# Patient Record
Sex: Male | Born: 1995 | Race: White | Hispanic: No | Marital: Single | Smoking: Never smoker
Health system: Southern US, Community
[De-identification: ages and names within clinical notes are randomized; demographics above are authoritative.]

## PROBLEM LIST (undated history)

## (undated) DIAGNOSIS — F32A Depression, unspecified: Secondary | ICD-10-CM

## (undated) DIAGNOSIS — IMO0002 Reserved for concepts with insufficient information to code with codable children: Secondary | ICD-10-CM

## (undated) DIAGNOSIS — K219 Gastro-esophageal reflux disease without esophagitis: Secondary | ICD-10-CM

## (undated) DIAGNOSIS — F419 Anxiety disorder, unspecified: Secondary | ICD-10-CM

## (undated) DIAGNOSIS — R079 Chest pain, unspecified: Secondary | ICD-10-CM

## (undated) DIAGNOSIS — T7840XA Allergy, unspecified, initial encounter: Secondary | ICD-10-CM

## (undated) DIAGNOSIS — F329 Major depressive disorder, single episode, unspecified: Secondary | ICD-10-CM

## (undated) DIAGNOSIS — M419 Scoliosis, unspecified: Secondary | ICD-10-CM

## (undated) DIAGNOSIS — F845 Asperger's syndrome: Secondary | ICD-10-CM

## (undated) HISTORY — DX: Gastro-esophageal reflux disease without esophagitis: K21.9

## (undated) HISTORY — PX: COLONOSCOPY: SHX174

## (undated) HISTORY — DX: Reserved for concepts with insufficient information to code with codable children: IMO0002

## (undated) HISTORY — DX: Allergy, unspecified, initial encounter: T78.40XA

## (undated) HISTORY — PX: UPPER GI ENDOSCOPY: SHX6162

## (undated) HISTORY — DX: Depression, unspecified: F32.A

## (undated) HISTORY — DX: Major depressive disorder, single episode, unspecified: F32.9

## (undated) HISTORY — DX: Chest pain, unspecified: R07.9

## (undated) HISTORY — DX: Asperger's syndrome: F84.5

## (undated) HISTORY — DX: Anxiety disorder, unspecified: F41.9

---
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
## 2014-07-13 ENCOUNTER — Ambulatory Visit (INDEPENDENT_AMBULATORY_CARE_PROVIDER_SITE_OTHER): Payer: Federal, State, Local not specified - PPO | Admitting: Emergency Medicine

## 2014-07-13 ENCOUNTER — Ambulatory Visit (INDEPENDENT_AMBULATORY_CARE_PROVIDER_SITE_OTHER): Payer: Federal, State, Local not specified - PPO

## 2014-07-13 VITALS — BP 124/72 | HR 118 | Temp 98.4°F | Resp 18 | Ht 69.0 in | Wt 174.0 lb

## 2014-07-13 DIAGNOSIS — F411 Generalized anxiety disorder: Secondary | ICD-10-CM | POA: Diagnosis not present

## 2014-07-13 DIAGNOSIS — F845 Asperger's syndrome: Secondary | ICD-10-CM

## 2014-07-13 DIAGNOSIS — R079 Chest pain, unspecified: Secondary | ICD-10-CM

## 2014-07-13 LAB — POCT CBC
GRANULOCYTE PERCENT: 59.8 % (ref 37–80)
HCT, POC: 48.6 % (ref 43.5–53.7)
Hemoglobin: 15.8 g/dL (ref 14.1–18.1)
LYMPH, POC: 1.7 (ref 0.6–3.4)
MCH, POC: 26.5 pg — AB (ref 27–31.2)
MCHC: 32.5 g/dL (ref 31.8–35.4)
MCV: 81.6 fL (ref 80–97)
MID (cbc): 0.4 (ref 0–0.9)
MPV: 7.6 fL (ref 0–99.8)
PLATELET COUNT, POC: 216 10*3/uL (ref 142–424)
POC GRANULOCYTE: 3.2 (ref 2–6.9)
POC LYMPH %: 32.7 % (ref 10–50)
POC MID %: 7.5 %M (ref 0–12)
RBC: 5.95 M/uL (ref 4.69–6.13)
RDW, POC: 12.9 %
WBC: 5.3 10*3/uL (ref 4.6–10.2)

## 2014-07-13 LAB — COMPLETE METABOLIC PANEL WITH GFR
ALBUMIN: 4.7 g/dL (ref 3.5–5.2)
ALK PHOS: 69 U/L (ref 39–117)
ALT: 18 U/L (ref 0–53)
AST: 17 U/L (ref 0–37)
BILIRUBIN TOTAL: 0.9 mg/dL (ref 0.2–1.2)
BUN: 10 mg/dL (ref 6–23)
CO2: 27 mEq/L (ref 19–32)
Calcium: 9.7 mg/dL (ref 8.4–10.5)
Chloride: 101 mEq/L (ref 96–112)
Creat: 1.16 mg/dL (ref 0.50–1.35)
GFR, Est African American: >89 mL/min
GFR, Est Non African American: >89 mL/min
Glucose, Bld: 91 mg/dL (ref 70–99)
POTASSIUM: 4 meq/L (ref 3.5–5.3)
SODIUM: 140 meq/L (ref 135–145)
TOTAL PROTEIN: 7.1 g/dL (ref 6.0–8.3)

## 2014-07-13 LAB — CK: Total CK: 119 U/L (ref 7–232)

## 2014-07-13 LAB — TSH: TSH: 2.174 u[IU]/mL (ref 0.350–4.500)

## 2014-07-13 LAB — T4, FREE: Free T4: 1.48 ng/dL (ref 0.80–1.80)

## 2014-07-13 MED ORDER — LORAZEPAM 1 MG PO TABS: ORAL_TABLET | ORAL | Status: DC

## 2014-07-13 NOTE — Patient Instructions (Signed)
Generalized Anxiety Disorder Generalized anxiety disorder (GAD) is a mental disorder. It interferes with life functions, including relationships, work, and school. GAD is different from normal anxiety, which everyone experiences at some point in their lives in response to specific life events and activities. Normal anxiety actually helps us prepare for and get through these life events and activities. Normal anxiety goes away after the event or activity is over.  GAD causes anxiety that is not necessarily related to specific events or activities. It also causes excess anxiety in proportion to specific events or activities. The anxiety associated with GAD is also difficult to control. GAD can vary from mild to severe. People with severe GAD can have intense waves of anxiety with physical symptoms (panic attacks).  SYMPTOMS The anxiety and worry associated with GAD are difficult to control. This anxiety and worry are related to many life events and activities and also occur more days than not for 6 months or longer. People with GAD also have three or more of the following symptoms (one or more in children):  Restlessness.   Fatigue.  Difficulty concentrating.   Irritability.  Muscle tension.  Difficulty sleeping or unsatisfying sleep. DIAGNOSIS GAD is diagnosed through an assessment by your health care provider. Your health care provider will ask you questions aboutyour mood,physical symptoms, and events in your life. Your health care provider may ask you about your medical history and use of alcohol or drugs, including prescription medicines. Your health care provider may also do a physical exam and blood tests. Certain medical conditions and the use of certain substances can cause symptoms similar to those associated with GAD. Your health care provider may refer you to a mental health specialist for further evaluation. TREATMENT The following therapies are usually used to treat GAD:    Medication. Antidepressant medication usually is prescribed for long-term daily control. Antianxiety medicines may be added in severe cases, especially when panic attacks occur.   Talk therapy (psychotherapy). Certain types of talk therapy can be helpful in treating GAD by providing support, education, and guidance. A form of talk therapy called cognitive behavioral therapy can teach you healthy ways to think about and react to daily life events and activities.  Stress managementtechniques. These include yoga, meditation, and exercise and can be very helpful when they are practiced regularly. A mental health specialist can help determine which treatment is best for you. Some people see improvement with one therapy. However, other people require a combination of therapies. Document Released: 05/04/2012 Document Revised: 05/24/2013 Document Reviewed: 05/04/2012 Exit[employer] Patient Information 2015 [employer]. This information is not intended to replace advice given to you by your health care provider. Make sure you discuss any questions you have with your health care provider.  

## 2014-07-13 NOTE — Progress Notes (Addendum)
Subjective:   This chart was scribed for Nena Jordan, MD by Thea Alken, ED Scribe. This patient was seen in room 9 and the patient's care was started at 11:27 AM.   Patient ID: Juan Weber, male    DOB: [birth year], 19 y.o.   MRN: [id number]  HPI   Chief Complaint  Patient presents with  . Chest Pain    x3 weeks   . Dizziness    yesterday at work   . Depression    see screen    HPI Comments: Juan Weber is a 19 y.o. Male with hx of Asperger's who presents to the [facility] with multiple complaints. He reports foggy vision with a white tint when looking in bright in dim areas, intermittent CP that began a few weeks ago, muscle cramps, HA, shaky, cold and sometimes numb hands that began yesterday, face numbness and dizziness. Pt has been treated for stress and anxiety in the past and takes Wellbutrin, Vyvanse, Risperdal and Claritin, prescribed by his PCP who he hasn't seen in 2 years. He denies feeling depressed or stressed and states the only stressor at this time is his long distance relationship with his male partner who lives in [place]. Pt states he met his partner online and was last able to travel and spend time with her 2 months ago. 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 Pt lives at home with family. Pt denies smoking, drinking, and illicit drugs.  Past Medical History  Diagnosis Date  . Asperger's disorder   . Anxiety   . Depression   . Allergy   . Ulcer   . GERD (gastroesophageal reflux disease)    History reviewed. No pertinent past surgical history. Prior to Admission medications   Medication Sig Start Date End Date Taking? Authorizing Provider  buPROPion (WELLBUTRIN XL) 300 MG 24 hr tablet Take 300 mg by mouth daily.   Yes Historical Provider, MD  lisdexamfetamine (VYVANSE) 60 MG capsule Take  60 mg by mouth once.   Yes Historical Provider, MD  loratadine (CLARITIN) 10 MG tablet Take 10 mg by mouth daily.   Yes Historical Provider, MD  Probiotic Product ([place]) Take 1 tablet by mouth once.   Yes Historical Provider, MD  risperiDONE (RISPERDAL) 0.5 MG tablet Take 0.5 mg by mouth once.   Yes Historical Provider, MD  azithromycin (ZITHROMAX) 250 MG tablet Take all 4 tablets PO x 1 dose Patient not taking: Reported on 07/13/2014 10/14/12   Thao P Le, DO  sucralfate (CARAFATE) 1 GM/10ML suspension Take 10 mLs (1 g total) by mouth 4 (four) times daily. Patient not taking: Reported on 07/13/2014 11/06/11   Robyn Haber, MD   Depression screen [facility] 2/9 07/13/2014  Decreased Interest 2  Down, Depressed, Hopeless 2  PHQ - 2 Score 4  Altered sleeping 2  Tired, decreased energy 3  Change in appetite 2  Feeling bad or failure about yourself  3  Trouble concentrating 1  Moving slowly or fidgety/restless 1  Suicidal thoughts 0  PHQ-9 Score 16  Difficult doing work/chores Not difficult at all   Review of Systems  Eyes: Positive for visual disturbance.  Cardiovascular: Positive for chest pain.  Musculoskeletal: Positive for myalgias.  Neurological: Positive for dizziness and headaches.  Psychiatric/Behavioral: Negative for dysphoric mood. The patient is nervous/anxious.  Objective:   Physical Exam CONSTITUTIONAL: Well developed/well nourished HEAD: Normocephalic/atraumatic EYES: EOMI/PERRL ENMT: Mucous membranes moist NECK: supple no meningeal signs SPINE/BACK:entire spine nontender CV: S1/S2 noted, no murmurs/rubs/gallops noted LUNGS: Lungs are clear to auscultation bilaterally, no apparent distress ABDOMEN: soft, nontender, no rebound or guarding, bowel sounds noted throughout abdomen GU:no cva tenderness NEURO: Pt is awake/alert/appropriate, moves all extremitiesx4.  No facial droop.   EXTREMITIES: pulses normal/equal, full ROM SKIN: warm, color  normal PSYCH: no abnormalities of mood noted, alert and oriented to situation  Filed Vitals:   07/13/14 1017  BP: 124/72  Pulse: 118  Temp: 98.4 F (36.9 C)  TempSrc: Oral  Resp: 18  Height: _0  (1.753 m)  Weight: 174 lb (78.926 kg)  SpO2: 98%   Results for orders placed or performed in visit on 07/13/14  POCT CBC  Result Value Ref Range   WBC 5.3 4.6 - 10.2 K/uL   Lymph, poc 1.7 0.6 - 3.4   POC LYMPH PERCENT 32.7 10 - 50 %L   MID (cbc) 0.4 0 - 0.9   POC MID % 7.5 0 - 12 %M   POC Granulocyte 3.2 2 - 6.9   Granulocyte percent 59.8 37 - 80 %G   RBC 5.95 4.69 - 6.13 M/uL   Hemoglobin 15.8 14.1 - 18.1 g/dL   HCT, POC 48.6 43.5 - 53.7 %   MCV 81.6 80 - 97 fL   MCH, POC 26.5 (A) 27 - 31.2 pg   MCHC 32.5 31.8 - 35.4 g/dL   RDW, POC 12.9 %   Platelet Count, POC 216 142 - 424 K/uL   MPV 7.6 0 - 99.8 fL  PDQ testing was moderate to severe. [facility] reading (PRIMARY) by  Dr.Daub no acute disease  EKG normal sinus rhythm Assessment & Plan:  I advised patient to call Dr. Corky Sing office to get an appointment. I think he would do well to come off of the Vyvanse. I gave him a prescription for #10 1 mg Ativan to take if he felt a panic attack coming on.I personally performed the services described in this documentation, which was scribed in my presence. The recorded information has been reviewed and is accurate. Patient is trying to get his girlfriend moved down here from [place]. He is in a sexual relationship with her. Since he is not able to see her he masturbates at least twice a day. This gives him some sexual relief..  I personally performed the services described in this documentation, which was scribed in my presence. The recorded information has been reviewed and is accurate.  Nena Jordan, MD Nena Jordan, MD

## 2014-07-18 ENCOUNTER — Encounter: Payer: Self-pay | Admitting: Emergency Medicine

## 2014-09-14 ENCOUNTER — Ambulatory Visit (INDEPENDENT_AMBULATORY_CARE_PROVIDER_SITE_OTHER): Payer: Federal, State, Local not specified - PPO | Admitting: Family Medicine

## 2014-09-14 VITALS — BP 130/90 | HR 99 | Temp 98.7°F | Resp 16 | Ht 68.25 in | Wt 177.0 lb

## 2014-09-14 DIAGNOSIS — F32A Depression, unspecified: Secondary | ICD-10-CM

## 2014-09-14 DIAGNOSIS — F329 Major depressive disorder, single episode, unspecified: Secondary | ICD-10-CM

## 2014-09-14 DIAGNOSIS — F411 Generalized anxiety disorder: Secondary | ICD-10-CM | POA: Diagnosis not present

## 2014-09-14 DIAGNOSIS — R0789 Other chest pain: Secondary | ICD-10-CM | POA: Diagnosis not present

## 2014-09-14 NOTE — Progress Notes (Signed)
Chief Complaint:  Chief Complaint  Patient presents with  . Pain    All over body    HPI: Juan Weber is a 19 y.o. male who reports to [facility] today complaining of:  Here for recheck of his prior complaints of chronic left cehst wall pain, he has had this since he was younger and now has progressively worsen . He has nonspecific upper left chest wall pain, starts around his left collarbone or 2 inches above his heart when hs is stressed, exerting himself,  Or can be at any time. He states is can feel like a pinch or very sharp. He has some nausea and also dizzy spells periodically associated with this , also  loss of vision where everything is blurry, it can get to the point where is he seeing spots. IT can last anywhere from 30 min to 3 hours, it does radiate all over to the rest of his body. The sxs stop spotnaneously or when he does deep breathing. 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   On his last visit he was given ativan, he took 1-2 pills and di not like how it made him feel. He went back to his psychiatrist who prescribes his vyvanse, risperdal and also wellbutrin which he has been on for many years to help contrl is anxiety/depression and outburst. He  States Dr Dalene Seltzer dnot cahnge his meds. Labs were done on the last visit and were all normal. 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 This person was out of work for 1 week and he was sxs free but he ha shad more sxs when this person ha been back.   The patient doe snot see a therapist, last time he saw one he felt it did not help him.    Prior OV from Dr Perfecto Kingdom visit in 06/2014 [name] is a 19 y.o. Male with hx of Asperger's who presents to the [facility] with multiple complaints. He  reports foggy vision with a white tint when looking in bright in dim areas, intermittent CP that began a few weeks ago, muscle cramps, HA, shaky, cold and sometimes numb hands that began yesterday, face numbness and dizziness. Pt has been treated for stress and anxiety in the past and takes Wellbutrin, Vyvanse, Risperdal and Claritin, prescribed by his PCP who he hasn't seen in 2 years. He denies feeling depressed or stressed and states the only stressor at this time is his long distance relationship with his male partner who lives in [place]. Pt states he met his partner online and was last able to travel and spend time with her 2 months ago. 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 Pt lives at home with family. Pt denies smoking, drinking, and illicit drugs.   Past Medical History  Diagnosis Date  . Asperger's disorder   . Anxiety   . Depression   . Allergy   . Ulcer   . GERD (gastroesophageal reflux disease)    History reviewed. No pertinent past surgical history. Social History   Social  History  . Marital Status: Single    Spouse Name: N/A  . Number of Children: N/A  . Years of Education: N/A   Social History Main Topics  . Smoking status: Never Smoker   . Smokeless tobacco: None  . Alcohol Use: No  . Drug Use: No  . Sexual Activity: Not Currently   Other Topics Concern  . None   Social History Narrative   Family History  Problem Relation Age of Onset  . Adopted: Yes   No Known Allergies Prior to Admission medications   Medication Sig Start Date End Date Taking? Authorizing Provider  buPROPion (WELLBUTRIN XL) 300 MG 24 hr tablet Take 300 mg by mouth daily.   Yes Historical Provider, MD  lisdexamfetamine (VYVANSE) 60 MG capsule Take 60 mg by mouth once.   Yes Historical Provider, MD  loratadine (CLARITIN) 10 MG tablet  Take 10 mg by mouth daily.   Yes Historical Provider, MD  LORazepam (ATIVAN) 1 MG tablet Take 1 tablet at onset of any anxiety attack. 07/13/14  Yes Darlyne Russian, MD  Probiotic Product ([place]) Take 1 tablet by mouth once.   Yes Historical Provider, MD  risperiDONE (RISPERDAL) 0.5 MG tablet Take 0.5 mg by mouth once.   Yes Historical Provider, MD  sucralfate (CARAFATE) 1 GM/10ML suspension Take 10 mLs (1 g total) by mouth 4 (four) times daily. 11/06/11  Yes Robyn Haber, MD  azithromycin (ZITHROMAX) 250 MG tablet Take all 4 tablets PO x 1 dose Patient not taking: Reported on 07/13/2014 10/14/12   Zoha Spranger P Deshanna Kama, DO     ROS: The patient denies fevers, chills, night sweats, unintentional weight loss, palpitations, wheezing, dyspnea on exertion, nausea, vomiting, abdominal pain, dysuria, hematuria, melena, numbness, weakness, or tingling.   All other systems have been reviewed and were otherwise negative with the exception of those mentioned in the HPI and as above.    PHYSICAL EXAM: Filed Vitals:   09/14/14 1930  BP: 130/90  Pulse: 99  Temp: 98.7 F (37.1 C)  Resp: 16   Body mass index is 26.7 kg/(m^2).   General: Alert, no acute distress HEENT:  Normocephalic, atraumatic, oropharynx patent. EOMI, PERRLA Cardiovascular:  Regular rate and rhythm, no rubs murmurs or gallops.  No Carotid bruits, radial pulse intact. No pedal edema.  Respiratory: Clear to auscultation bilaterally.  No wheezes, rales, or rhonchi.  No cyanosis, no use of accessory musculature He has some chest wall tenderness but nothing substantial Abdominal: No organomegaly, abdomen is soft and non-tender, positive bowel sounds. No masses. Skin: No rashes. Neurologic: Facial musculature symmetric. Psychiatric: Patient acts appropriately throughout our interaction.  Lymphatic: No cervical or submandibular lymphadenopathy Musculoskeletal: Gait intact. No edema, tenderness + gynecomastia   LABS: Results  for orders placed or performed in visit on 07/13/14  TSH  Result Value Ref Range   TSH 2.174 0.350 - 4.500 uIU/mL  T4, free  Result Value Ref Range   Free T4 1.48 0.80 - 1.80 ng/dL  COMPLETE METABOLIC PANEL WITH GFR  Result Value Ref Range   Sodium 140 135 - 145 mEq/L   Potassium 4.0 3.5 - 5.3 mEq/L   Chloride 101 96 - 112 mEq/L   CO2 27 19 - 32 mEq/L   Glucose, Bld 91 70 - 99 mg/dL   BUN 10 6 - 23 mg/dL   Creat 1.16 0.50 - 1.35 mg/dL   Total Bilirubin 0.9 0.2 - 1.2 mg/dL   Alkaline Phosphatase 69 39 - 117  U/L   AST 17 0 - 37 U/L   ALT 18 0 - 53 U/L   Total Protein 7.1 6.0 - 8.3 g/dL   Albumin 4.7 3.5 - 5.2 g/dL   Calcium 9.7 8.4 - 10.5 mg/dL   GFR, Est African American >89 mL/min   GFR, Est Non African American >89 mL/min  CK  Result Value Ref Range   Total CK 119 7 - 232 U/L  POCT CBC  Result Value Ref Range   WBC 5.3 4.6 - 10.2 K/uL   Lymph, poc 1.7 0.6 - 3.4   POC LYMPH PERCENT 32.7 10 - 50 %L   MID (cbc) 0.4 0 - 0.9   POC MID % 7.5 0 - 12 %M   POC Granulocyte 3.2 2 - 6.9   Granulocyte percent 59.8 37 - 80 %G   RBC 5.95 4.69 - 6.13 M/uL   Hemoglobin 15.8 14.1 - 18.1 g/dL   HCT, POC 48.6 43.5 - 53.7 %   MCV 81.6 80 - 97 fL   MCH, POC 26.5 (A) 27 - 31.2 pg   MCHC 32.5 31.8 - 35.4 g/dL   RDW, POC 12.9 %   Platelet Count, POC 216 142 - 424 K/uL   MPV 7.6 0 - 99.8 fL     EKG/XRAY:   Primary read interpreted by [facility] Marin Comment at [facility].   ASSESSMENT/PLAN: Encounter Diagnoses  Name Primary?  . Atypical chest pain Yes  . Anxiety state   . Depression    Reda is a pleasant 19 y/o male with a PMH of anxiety, depresssion and Aspergers who presents with acute on chronic left sided CP which is worse with stress but at time also exertion and other factors. I do not suspect this is cardiac related but he i son a lot of medicines that can affect the heart and he is worried and I have referred him to cardiology to rule that out for piece of mind. He does have GERD but this does  not seem GERD related.  I have advised him to return to Dr Corky Sing office, his psychiatrist to see if there needs to be any change sor adjustments to his meds.  Fu prn   Gross sideeffects, risk and benefits, and alternatives of medications d/w patient. Patient is aware that all medications have potential sideeffects and we are unable to predict every sideeffect or drug-drug interaction that may occur.  Dabid Godown DO  09/15/2014 6:58 AM

## 2014-11-17 ENCOUNTER — Encounter: Payer: Self-pay | Admitting: Family Medicine

## 2015-06-20 ENCOUNTER — Ambulatory Visit (INDEPENDENT_AMBULATORY_CARE_PROVIDER_SITE_OTHER): Payer: Federal, State, Local not specified - PPO

## 2015-06-20 ENCOUNTER — Ambulatory Visit (INDEPENDENT_AMBULATORY_CARE_PROVIDER_SITE_OTHER): Payer: Federal, State, Local not specified - PPO | Admitting: Physician Assistant

## 2015-06-20 VITALS — BP 123/88 | HR 102 | Temp 97.7°F | Resp 16 | Ht 68.5 in | Wt 193.0 lb

## 2015-06-20 DIAGNOSIS — K219 Gastro-esophageal reflux disease without esophagitis: Secondary | ICD-10-CM

## 2015-06-20 DIAGNOSIS — R319 Hematuria, unspecified: Secondary | ICD-10-CM | POA: Diagnosis not present

## 2015-06-20 DIAGNOSIS — R350 Frequency of micturition: Secondary | ICD-10-CM | POA: Diagnosis not present

## 2015-06-20 DIAGNOSIS — Z139 Encounter for screening, unspecified: Secondary | ICD-10-CM

## 2015-06-20 LAB — CBC
HEMATOCRIT: 46.7 % (ref 38.5–50.0)
Hemoglobin: 15.8 g/dL (ref 13.2–17.1)
MCH: 28.1 pg (ref 27.0–33.0)
MCHC: 33.8 g/dL (ref 32.0–36.0)
MCV: 82.9 fL (ref 80.0–100.0)
MPV: 10.9 fL (ref 7.5–12.5)
PLATELETS: 217 10*3/uL (ref 140–400)
RBC: 5.63 MIL/uL (ref 4.20–5.80)
RDW: 14 % (ref 11.0–15.0)
WBC: 5.6 10*3/uL (ref 3.8–10.8)

## 2015-06-20 LAB — COMPLETE METABOLIC PANEL WITH GFR
ALT: 23 U/L (ref 9–46)
AST: 17 U/L (ref 10–40)
Albumin: 4.6 g/dL (ref 3.6–5.1)
Alkaline Phosphatase: 73 U/L (ref 40–115)
BILIRUBIN TOTAL: 0.7 mg/dL (ref 0.2–1.2)
BUN: 11 mg/dL (ref 7–25)
CHLORIDE: 101 mmol/L (ref 98–110)
CO2: 24 mmol/L (ref 20–31)
Calcium: 9.9 mg/dL (ref 8.6–10.3)
Creat: 1.04 mg/dL (ref 0.60–1.35)
Glucose, Bld: 91 mg/dL (ref 65–99)
Potassium: 4.3 mmol/L (ref 3.5–5.3)
Sodium: 138 mmol/L (ref 135–146)
Total Protein: 7.3 g/dL (ref 6.1–8.1)

## 2015-06-20 LAB — POCT URINALYSIS DIP (MANUAL ENTRY)
BILIRUBIN UA: NEGATIVE
Bilirubin, UA: NEGATIVE
Glucose, UA: NEGATIVE
Leukocytes, UA: NEGATIVE
Nitrite, UA: NEGATIVE
PROTEIN UA: NEGATIVE
Spec Grav, UA: 1.02
Urobilinogen, UA: 0.2
pH, UA: 7

## 2015-06-20 LAB — POC MICROSCOPIC URINALYSIS (UMFC)

## 2015-06-20 LAB — POCT GLYCOSYLATED HEMOGLOBIN (HGB A1C): HEMOGLOBIN A1C: 5.5

## 2015-06-20 LAB — HIV ANTIBODY (ROUTINE TESTING W REFLEX): HIV: NONREACTIVE

## 2015-06-20 MED ORDER — OMEPRAZOLE 20 MG PO CPDR: 20.0000 mg | DELAYED_RELEASE_CAPSULE | Freq: Every day | ORAL | Status: DC

## 2015-06-20 MED ORDER — OMEPRAZOLE 10 MG PO CPDR: 10.0000 mg | DELAYED_RELEASE_CAPSULE | Freq: Every day | ORAL | Status: DC

## 2015-06-20 NOTE — Progress Notes (Signed)
Did you give him a strainer in case he does pass a stone?

## 2015-06-20 NOTE — Progress Notes (Signed)
06/20/2015 1:09 PM   DOB: Jun 20, 1995 / MRN: [id number]  SUBJECTIVE:  Juan Weber is a 20 y.o. male presenting for bladder discomfort and GERD.    Reports the bladder discomfort started 3 days ago.  Describes the pain ans dull, transient, and sporadic.  He denies pain at this time.  Associates mild dysuria and urinary frequency.  Denies history of stones.  No new partners.  Complains of daily acid reflux.  Has been suffering "for years" and cant remember exactly when it started  He is getting worse and associates certain spicy and rich foods.  Does not drink coffee.  Does not elevate the head of the bed.  Takes "acid reducer" as needed and does not know the name.  Says he gets relief within thirty minutes and relief may last as long as 6 hours.  Denies N/V, dysphagia, odynophagia, weight loss, smoking.   There is no immunization history on file for this patient.   He has No Known Allergies.   He  has a past medical history of Asperger's disorder; Anxiety; Depression; Allergy; Ulcer; GERD (gastroesophageal reflux disease); and Chest pain.    He  reports that he has never smoked. He does not have any smokeless tobacco history on file. He reports that he does not drink alcohol or use illicit drugs. He  reports that he does not currently engage in sexual activity. The patient  has no past surgical history on file.  His family history is not on file. He was adopted.  Review of Systems  Constitutional: Negative for fever.  HENT: Negative for sore throat.   Eyes: Negative for photophobia.  Respiratory: Negative for cough.   Cardiovascular: Negative for chest pain.  Gastrointestinal: Negative for diarrhea, constipation, blood in stool and melena.  Musculoskeletal: Negative for myalgias.  Skin: Negative for rash.  Neurological: Negative for dizziness.    Problem list and medications reviewed and updated by myself where necessary, and exist elsewhere in the encounter.   OBJECTIVE:  BP  123/88 mmHg  Pulse 102  Temp(Src) 97.7 F (36.5 C) (Oral)  Resp 16  Ht 5' 8.5" (1.74 m)  Wt 193 lb (87.544 kg)  BMI 28.92 kg/m2  SpO2 99%  Physical Exam  Constitutional: He is oriented to person, place, and time. He appears well-developed. He does not appear ill.  Eyes: Conjunctivae and EOM are normal. Pupils are equal, round, and reactive to light.  Cardiovascular: Normal rate and regular rhythm.   Pulmonary/Chest: Effort normal and breath sounds normal.  Abdominal: Soft. Bowel sounds are normal. He exhibits no distension and no mass. There is no tenderness. There is no rebound and no guarding.  Musculoskeletal: Normal range of motion.  Neurological: He is alert and oriented to person, place, and time. No cranial nerve deficit. Coordination normal.  Skin: Skin is warm and dry. He is not diaphoretic.  Psychiatric: He has a normal mood and affect.  Nursing note and vitals reviewed.   Results for orders placed or performed in visit on 06/20/15 (from the past 72 hour(s))  POCT Microscopic Urinalysis ([facility])     Status: Abnormal   Collection Time: 06/20/15 11:47 AM  Result Value Ref Range   WBC,UR,HPF,POC None None WBC/hpf   RBC,UR,HPF,POC Too numerous to count  (A) None RBC/hpf   Bacteria Few (A) None, Too numerous to count   Mucus Present (A) Absent   Epithelial Cells, UR Per Microscopy Few (A) None, Too numerous to count cells/hpf  POCT urinalysis dipstick  Status: Abnormal   Collection Time: 06/20/15 11:47 AM  Result Value Ref Range   Color, UA yellow yellow   Clarity, UA clear clear   Glucose, UA negative negative   Bilirubin, UA negative negative   Ketones, POC UA negative negative   Spec Grav, UA 1.020    Blood, UA moderate (A) negative   pH, UA 7.0    Protein Ur, POC negative negative   Urobilinogen, UA 0.2    Nitrite, UA Negative Negative   Leukocytes, UA Negative Negative    Dg Abd 1 View  06/20/2015  CLINICAL DATA:  Dysuria and hematuria. EXAM: ABDOMEN - 1  VIEW COMPARISON:  10/30/2006 FINDINGS: No abnormal stool retention or obstruction. No visible urinary calculus. The questioned right renal calculus on the previous study could be obscured by bowel gas. No concerning intra-abdominal mass effect. Negative visualized skeleton. IMPRESSION: Negative study.  No evidence of urolithiasis. Electronically Signed   By: Marnee Spring M.D.   On: 06/20/2015 12:54    ASSESSMENT AND PLAN  Omaree was seen today for bladder discomfort and gastroesophageal reflux.  Diagnoses and all orders for this visit:  Frequent urination: He has quite a bit of blood in his urine. Will screen for a stone. His symptoms are mild and rads are negative.  Advised we hold on therapy until work up is complete at which time I will call him and see how he is feeling.  He may need a CT if he does not improve on his own and work up is negative.  -     POCT Microscopic Urinalysis ([facility]) -     POCT urinalysis dipstick -     POCT glycosylated hemoglobin (Hb A1C) -     COMPLETE METABOLIC PANEL WITH GFR -     CBC   Gastroesophageal reflux disease, esophagitis presence not specified: Will check for H. Pylori.  Will start omeprazole 20 mg daily qam 30 minutes before breakfast either way.  -     H. pylori breath test  Hematuria -     Urine culture -     GC/Chlamydia Probe Amp -     DG Abd 1 View; Future  Screening -     HIV antibody    The patient was advised to call or return to clinic if he does not see an improvement in symptoms or to seek the care of the closest emergency department if he worsens with the above plan.   Deliah Boston, MHS, PA-C [facility] 06/20/2015 1:09 PM

## 2015-06-20 NOTE — Patient Instructions (Signed)
     IF you received an x-ray today, you will receive an invoice from [facility]. Please contact [facility] at [phone] with questions or concerns regarding your invoice.   IF you received labwork today, you will receive an invoice from [facility]. Please contact [facility] at [phone] with questions or concerns regarding your invoice.   Our billing staff will not be able to assist you with questions regarding bills from these companies.  You will be contacted with the lab results as soon as they are available. The fastest way to get your results is to activate your My Chart account. Instructions are located on the last page of this paperwork. If you have not heard from us regarding the results in 2 weeks, please contact this office.      

## 2015-06-21 ENCOUNTER — Other Ambulatory Visit: Payer: Self-pay | Admitting: Physician Assistant

## 2015-06-21 DIAGNOSIS — A048 Other specified bacterial intestinal infections: Secondary | ICD-10-CM

## 2015-06-21 LAB — GC/CHLAMYDIA PROBE AMP
CT PROBE, AMP APTIMA: NOT DETECTED
GC Probe RNA: NOT DETECTED

## 2015-06-21 LAB — URINE CULTURE

## 2015-06-21 LAB — H. PYLORI BREATH TEST: H. pylori Breath Test: DETECTED — AB

## 2015-06-21 MED ORDER — AMOXICILL-CLARITHRO-LANSOPRAZ PO MISC: Freq: Two times a day (BID) | ORAL | Status: DC

## 2015-06-21 NOTE — Progress Notes (Signed)
H. Pylori positive.  Will start therapy.  Deliah BostonMichael Jerrell Mangel, MS, PA-C 8:48 PM, 06/21/2015

## 2015-06-22 NOTE — Progress Notes (Signed)
Pt advised.

## 2015-06-24 ENCOUNTER — Emergency Department (HOSPITAL_COMMUNITY): Payer: Federal, State, Local not specified - PPO

## 2015-06-24 ENCOUNTER — Emergency Department (HOSPITAL_COMMUNITY)
Admission: EM | Admit: 2015-06-24 | Discharge: 2015-06-24 | Disposition: A | Discharge status: Home / Self Care | Payer: Federal, State, Local not specified - PPO | Attending: Emergency Medicine | Admitting: Emergency Medicine

## 2015-06-24 ENCOUNTER — Encounter (HOSPITAL_COMMUNITY): Payer: Self-pay | Admitting: Emergency Medicine

## 2015-06-24 DIAGNOSIS — N200 Calculus of kidney: Secondary | ICD-10-CM | POA: Insufficient documentation

## 2015-06-24 DIAGNOSIS — F329 Major depressive disorder, single episode, unspecified: Secondary | ICD-10-CM | POA: Diagnosis not present

## 2015-06-24 DIAGNOSIS — Z79899 Other long term (current) drug therapy: Secondary | ICD-10-CM | POA: Diagnosis not present

## 2015-06-24 DIAGNOSIS — R109 Unspecified abdominal pain: Secondary | ICD-10-CM | POA: Diagnosis present

## 2015-06-24 LAB — CBC WITH DIFFERENTIAL/PLATELET
Basophils Absolute: 0 10*3/uL (ref 0.0–0.1)
Basophils Relative: 0 %
EOS ABS: 0.2 10*3/uL (ref 0.0–0.7)
EOS PCT: 4 %
HCT: 44.8 % (ref 39.0–52.0)
HEMOGLOBIN: 15.7 g/dL (ref 13.0–17.0)
LYMPHS ABS: 2 10*3/uL (ref 0.7–4.0)
LYMPHS PCT: 38 %
MCH: 28.2 pg (ref 26.0–34.0)
MCHC: 35 g/dL (ref 30.0–36.0)
MCV: 80.4 fL (ref 78.0–100.0)
MONOS PCT: 10 %
Monocytes Absolute: 0.5 10*3/uL (ref 0.1–1.0)
NEUTROS PCT: 48 %
Neutro Abs: 2.6 10*3/uL (ref 1.7–7.7)
Platelets: 205 10*3/uL (ref 150–400)
RBC: 5.57 MIL/uL (ref 4.22–5.81)
RDW: 12.6 % (ref 11.5–15.5)
WBC: 5.4 10*3/uL (ref 4.0–10.5)

## 2015-06-24 LAB — LIPASE, BLOOD: LIPASE: 29 U/L (ref 11–51)

## 2015-06-24 LAB — URINE MICROSCOPIC-ADD ON: WBC UA: NONE SEEN WBC/hpf (ref 0–5)

## 2015-06-24 LAB — COMPREHENSIVE METABOLIC PANEL
ALK PHOS: 65 U/L (ref 38–126)
ALT: 46 U/L (ref 17–63)
ANION GAP: 12 (ref 5–15)
AST: 31 U/L (ref 15–41)
Albumin: 4.5 g/dL (ref 3.5–5.0)
BUN: 11 mg/dL (ref 6–20)
CALCIUM: 10.3 mg/dL (ref 8.9–10.3)
CO2: 29 mmol/L (ref 22–32)
CREATININE: 1.1 mg/dL (ref 0.61–1.24)
Chloride: 99 mmol/L — ABNORMAL LOW (ref 101–111)
Glucose, Bld: 93 mg/dL (ref 65–99)
Potassium: 3.8 mmol/L (ref 3.5–5.1)
SODIUM: 140 mmol/L (ref 135–145)
TOTAL PROTEIN: 7.7 g/dL (ref 6.5–8.1)
Total Bilirubin: 0.7 mg/dL (ref 0.3–1.2)

## 2015-06-24 LAB — URINALYSIS, ROUTINE W REFLEX MICROSCOPIC
BILIRUBIN URINE: NEGATIVE
Glucose, UA: NEGATIVE mg/dL
KETONES UR: NEGATIVE mg/dL
LEUKOCYTES UA: NEGATIVE
NITRITE: NEGATIVE
PROTEIN: NEGATIVE mg/dL
Specific Gravity, Urine: 1.029 (ref 1.005–1.030)
pH: 6 (ref 5.0–8.0)

## 2015-06-24 MED ORDER — TAMSULOSIN HCL 0.4 MG PO CAPS: 0.4000 mg | ORAL_CAPSULE | Freq: Every day | ORAL | Status: DC

## 2015-06-24 MED ORDER — ONDANSETRON HCL 4 MG/2ML IJ SOLN
4.0000 mg | Freq: Once | INTRAMUSCULAR | Status: AC
  Administered 2015-06-24: 4 mg via INTRAVENOUS
  Filled 2015-06-24: qty 2

## 2015-06-24 MED ORDER — KETOROLAC TROMETHAMINE 30 MG/ML IJ SOLN
30.0000 mg | Freq: Once | INTRAMUSCULAR | Status: AC
  Administered 2015-06-24: 30 mg via INTRAVENOUS
  Filled 2015-06-24: qty 1

## 2015-06-24 MED ORDER — SODIUM CHLORIDE 0.9 % IV BOLUS (SEPSIS)
1000.0000 mL | Freq: Once | INTRAVENOUS | Status: AC
  Administered 2015-06-24: 1000 mL via INTRAVENOUS

## 2015-06-24 MED ORDER — ONDANSETRON 4 MG PO TBDP: ORAL_TABLET | ORAL | Status: DC

## 2015-06-24 MED ORDER — HYDROCODONE-ACETAMINOPHEN 5-325 MG PO TABS: 1.0000 | ORAL_TABLET | Freq: Four times a day (QID) | ORAL | Status: DC | PRN

## 2015-06-24 NOTE — ED Notes (Signed)
Pt c/o right side flank pain and bladder pain. States he was seen at urgent care on 5/21 and they prescribed Lansoprazole, amoxicillin, and clarithromycin. Pt reports his pain is worse.

## 2015-06-24 NOTE — ED Provider Notes (Signed)
CSN: [id number]     Arrival date & time 06/24/15  0727 History   First MD Initiated Contact with Patient 06/24/15 0740     Chief Complaint  Patient presents with  . Flank Pain     (Consider location/radiation/quality/duration/timing/severity/associated sxs/prior Treatment) Patient is a 20 y.o. male presenting with flank pain. The history is provided by the patient (Patient complains of right flank pain for a few days no fever no chills).  Flank Pain This is a new problem. The current episode started more than 2 days ago. The problem occurs constantly. The problem has not changed since onset.Associated symptoms include abdominal pain. Pertinent negatives include no chest pain. Nothing aggravates the symptoms. Nothing relieves the symptoms. He has tried acetaminophen for the symptoms.    Past Medical History  Diagnosis Date  . Asperger's disorder   . Anxiety   . Depression   . Allergy   . Ulcer   . GERD (gastroesophageal reflux disease)   . Chest pain     [facility] The pt exercised according to Bruce Protocol, Total time recorded 6:58 min achieving max heart rate of 240 which was 120% of MPHR for age and 8.75 METS of work. Normal BP response. Resting ECG showing sinus tach. There was no ST-T changes of ischemia with exercise stress test. No arrythmias noted.    History reviewed. No pertinent past surgical history. Family History  Problem Relation Age of Onset  . Adopted: Yes   Social History  Substance Use Topics  . Smoking status: Never Smoker   . Smokeless tobacco: None  . Alcohol Use: No    Review of Systems  Cardiovascular: Negative for chest pain.  Gastrointestinal: Positive for abdominal pain.  Genitourinary: Positive for flank pain.      Allergies  Review of patient's allergies indicates no known allergies.  Home Medications   Prior to Admission medications   Medication Sig Start Date End Date Taking? Authorizing Provider   amoxicillin-clarithromycin-lansoprazole [facility]) combo pack Take by mouth 2 (two) times daily. Follow package directions. Stop taking omeprazole once staring this regimen. DO NOT MISS DOSES. Patient taking differently: Take by mouth 2 (two) times daily. Follow package directions. Stop taking omeprazole once staring this regimen. DO NOT MISS DOSES.  Lansoprazole  BID; CLarithromycin  BID; and Amoxicillin  2 tablets ( ) twice daily 06/21/15  Yes Ofilia Neas, PA-C  buPROPion (WELLBUTRIN XL) 300 MG 24 hr tablet Take 300 mg by mouth daily.   Yes Historical Provider, MD  ibuprofen (ADVIL,MOTRIN) 200 MG tablet Take 800 mg by mouth every 6 (six) hours as needed for headache or moderate pain.   Yes Historical Provider, MD  lisdexamfetamine (VYVANSE) 60 MG capsule Take 70 mg by mouth daily. Reported on 06/20/2015   Yes Historical Provider, MD  loratadine (CLARITIN) 10 MG tablet Take 10 mg by mouth daily.   Yes Historical Provider, MD  LORazepam (ATIVAN) 1 MG tablet Take 1 tablet at onset of any anxiety attack. Patient taking differently: Take 1 mg by mouth daily as needed (anxiety attacks). Take 1 tablet at onset of any anxiety attack. 07/13/14  Yes Collene Gobble, MD  omeprazole (PRILOSEC) 20 MG capsule Take 1 capsule (20 mg total) by mouth daily. Patient taking differently: Take 20 mg by mouth daily as needed (acid reflux/ heartburn).  06/20/15  Yes Ofilia Neas, PA-C  Probiotic Product (PHILLIPS COLON HEALTH PO) Take 1 tablet by mouth once.   Yes Historical Provider, MD  risperiDONE (RISPERDAL)  0.5 MG tablet Take 0.5 mg by mouth daily.    Yes Historical Provider, MD  VYVANSE 70 MG capsule Take 70 mg by mouth daily. 05/31/15  Yes Historical Provider, MD  HYDROcodone-acetaminophen (NORCO/VICODIN) 5-325 MG tablet Take 1 tablet by mouth every 6 (six) hours as needed for moderate pain. 06/24/15   Bethann BerkshireJoseph Adalynn Corne, MD  omeprazole (PRILOSEC) 10 MG capsule Take 1 capsule (10 mg total) by mouth  daily. Okay to fill in 40 days from original prescription date. Patient not taking: Reported on 06/24/2015 06/20/15   Ofilia NeasMichael L Clark, PA-C  ondansetron (ZOFRAN ODT) 4 MG disintegrating tablet 4mg  ODT q4 hours prn nausea/vomit 06/24/15   Bethann BerkshireJoseph Aurea Aronov, MD  tamsulosin (FLOMAX) 0.4 MG CAPS capsule Take 1 capsule (0.4 mg total) by mouth daily. 06/24/15   Bethann BerkshireJoseph Cannon Arreola, MD   BP 137/87 mmHg  Pulse 81  Temp(Src) 97.7 F (36.5 C) (Oral)  Resp 18  SpO2 98% Physical Exam  Constitutional: He is oriented to person, place, and time. He appears well-developed.  HENT:  Head: Normocephalic.  Eyes: Conjunctivae and EOM are normal. No scleral icterus.  Neck: Neck supple. No thyromegaly present.  Cardiovascular: Normal rate and regular rhythm.  Exam reveals no gallop and no friction rub.   No murmur heard. Pulmonary/Chest: No stridor. He has no wheezes. He has no rales. He exhibits no tenderness.  Abdominal: He exhibits no distension. There is no tenderness. There is no rebound.  Genitourinary:  Moderate tenderness to right flank  Musculoskeletal: Normal range of motion. He exhibits no edema.  Lymphadenopathy:    He has no cervical adenopathy.  Neurological: He is oriented to person, place, and time. He exhibits normal muscle tone. Coordination normal.  Skin: No rash noted. No erythema.  Psychiatric: He has a normal mood and affect. His behavior is normal.    ED Course  Procedures (including critical care time) Labs Review Labs Reviewed  COMPREHENSIVE METABOLIC PANEL - Abnormal; Notable for the following:    Chloride 99 (*)    All other components within normal limits  URINALYSIS, ROUTINE W REFLEX MICROSCOPIC (NOT AT [facility]) - Abnormal; Notable for the following:    Color, Urine AMBER (*)    APPearance CLOUDY (*)    Hgb urine dipstick LARGE (*)    All other components within normal limits  URINE MICROSCOPIC-ADD ON - Abnormal; Notable for the following:    Squamous Epithelial / LPF 0-5 (*)     Bacteria, UA FEW (*)    All other components within normal limits  CBC WITH DIFFERENTIAL/PLATELET  LIPASE, BLOOD    Imaging Review Ct Renal Stone Study  06/24/2015  CLINICAL DATA:  Acute right flank pain radiating into the pelvis. EXAM: CT ABDOMEN AND PELVIS WITHOUT CONTRAST TECHNIQUE: Multidetector CT imaging of the abdomen and pelvis was performed following the standard protocol without IV contrast. COMPARISON:  06/20/2015 plain radiographs FINDINGS: Lower chest: Minor left basilar dependent atelectasis. Right base clear. Normal heart size. No pericardial or pleural effusion. Hepatobiliary: No mass visualized on this un-enhanced exam. Pancreas: No mass or inflammatory process identified on this un-enhanced exam. Spleen: Within normal limits in size.  Accessory splenule noted. Adrenals/Urinary Tract: Normal adrenal glands. Right kidney demonstrates very mild hydronephrosis with associated hydroureter. Dilated right ureter extends into the pelvis where there is an obstructing right UVJ calculus measuring 4 mm, image 80. Left kidney and ureter demonstrate no acute process or obstruction. No additional intrarenal calculi on either side or focal contour abnormality. Stomach/Bowel: No evidence  of obstruction, inflammatory process, or abnormal fluid collections. Normal appendix demonstrated but containing hyperdense material versus appendicolith, image 56. Vascular/Lymphatic: No pathologically enlarged lymph nodes. No evidence of abdominal aortic aneurysm. Reproductive: No mass or other significant abnormality. Other: None. Musculoskeletal:  No suspicious bone lesions identified. IMPRESSION: 4 mm mildly obstructing right UVJ calculus with mild right hydroureteronephrosis. Electronically Signed   By: Judie Petit.  Shick M.D.   On: 06/24/2015 10:01   I have personally reviewed and evaluated these images and lab results as part of my medical decision-making.   EKG Interpretation None      MDM   Final diagnoses:   Kidney stone    Kidney stone 4 mm right UVJ. Patient prescribed Flomax Vicodin and Zofran and will follow-up with urology    Bethann Berkshire, MD 06/24/15 1145

## 2015-06-24 NOTE — Discharge Instructions (Signed)
Follow-up with [facility] this week. Return if vomiting or fever

## 2015-07-29 ENCOUNTER — Other Ambulatory Visit: Payer: Self-pay | Admitting: Physician Assistant

## 2015-10-15 ENCOUNTER — Emergency Department (HOSPITAL_COMMUNITY)
Admission: EM | Admit: 2015-10-15 | Discharge: 2015-10-15 | Disposition: A | Discharge status: Home / Self Care | Payer: Federal, State, Local not specified - PPO | Attending: Emergency Medicine | Admitting: Emergency Medicine

## 2015-10-15 ENCOUNTER — Encounter (HOSPITAL_COMMUNITY): Payer: Self-pay | Admitting: *Deleted

## 2015-10-15 DIAGNOSIS — M545 Low back pain: Secondary | ICD-10-CM | POA: Diagnosis not present

## 2015-10-15 HISTORY — DX: Scoliosis, unspecified: M41.9

## 2015-10-15 MED ORDER — NAPROXEN 500 MG PO TABS: 500.0000 mg | ORAL_TABLET | Freq: Two times a day (BID) | ORAL | 0 refills | Status: DC

## 2015-10-15 MED ORDER — DICLOFENAC SODIUM 3 % TD GEL: 1.0000 "application " | Freq: Two times a day (BID) | TRANSDERMAL | 0 refills | Status: DC

## 2015-10-15 NOTE — ED Triage Notes (Signed)
Pt reports left side back pain for years, thinks its related to scoliosis. Pain increases with movement, denies any urinary symptoms.

## 2015-10-15 NOTE — ED Notes (Signed)
Pt understood dc material. NAD noted. Scripts given at dc 

## 2015-10-15 NOTE — ED Notes (Signed)
C/o chronic back pain d/t scoliosis. States experiences intermittent numbness x since a child. States takes OTC meds - no relief. States is unable to perform his job of standing for 6-9 hours/day and lifting items less than 50 lbs. Requesting "for something to be done". Pt denies bladder/bowel problems. Denies numbness currently. States does not have PCP.

## 2015-10-15 NOTE — ED Provider Notes (Signed)
[facility]-EMERGENCY DEPT Provider Note   CSN: [id number] Arrival date & time: 10/15/15  1704  By signing my name below, I, Soijett Blue, attest that this documentation has been prepared under the direction and in the presence of Will Doniesha Landau, PA-C Electronically Signed: Soijett Blue, ED Scribe. 10/15/15. 7:51 PM.   History   Chief Complaint Chief Complaint  Patient presents with  . Back Pain    HPI  Juan Weber is a 20 y.o. male with a PMHx of Asperger's disorder, scoliosis, who presents to the Emergency Department complaining of left lower back pain since he was a child from scoliosis.  Pt states that he has had scoliosis for years that he has self-treated with OTC medications. Pt notes that his left lower back pain has increased due to prolonged standing and working at his new place of occupation. Pt notes that his left lower back pain is worsened with movement and he denies any alleviating factors. Pt denies recent injury, trauma, or fall. Pt denies any back pain at this time. He states that he has tried OTC pain reliever, Rx oxycodone, OTC aleve with no relief for his symptoms. Pt denies abdominal pain, nausea, vomiting, diffifculty urinating, numbness, tingling, weakness, fever, color change, rash, wound, and any other symptoms. Denies CA, illegal drugs, or IV drug use. Denies PCP at this time. Pt has his Asperger's medications prescribed to him by Dr. Len Blalockavid Fuller.     The history is provided by the patient. No language interpreter was used.    Past Medical History:  Diagnosis Date  . Allergy   . Anxiety   . Asperger's disorder   . Chest pain    [facility] The pt exercised according to [facility] Protocol, Total time recorded 6:58 min achieving max heart rate of 240 which was 120% of MPHR for age and 8.75 METS of work. Normal BP response. Resting ECG showing sinus tach. There was no ST-T changes of ischemia with exercise stress test. No arrythmias noted.   . Depression     . GERD (gastroesophageal reflux disease)   . Scoliosis   . Ulcer     Patient Active Problem List   Diagnosis Date Noted  . Asperger's syndrome 02/21/2011    History reviewed. No pertinent surgical history.     Home Medications    Prior to Admission medications   Medication Sig Start Date End Date Taking? Authorizing Provider  amoxicillin-clarithromycin-lansoprazole [facility](PREVPAC) combo pack Take by mouth 2 (two) times daily. Follow package directions. Stop taking omeprazole once staring this regimen. DO NOT MISS DOSES. Patient taking differently: Take by mouth 2 (two) times daily. Follow package directions. Stop taking omeprazole once staring this regimen. DO NOT MISS DOSES.  Lansoprazole 30mg  BID; CLarithromycin 500mg  BID; and Amoxicillin 500mg  2 tablets (1000mg ) twice daily 06/21/15   Ofilia NeasMichael L Clark, PA-C  buPROPion (WELLBUTRIN XL) 300 MG 24 hr tablet Take 300 mg by mouth daily.    Historical Provider, MD  Diclofenac Sodium 3 % GEL Place 1 application onto the skin 2 (two) times daily. To the affected area. 10/15/15   Everlene FarrierWilliam Macdonald Rigor, PA-C  HYDROcodone-acetaminophen (NORCO/VICODIN) 5-325 MG tablet Take 1 tablet by mouth every 6 (six) hours as needed for moderate pain. 06/24/15   Bethann BerkshireJoseph Zammit, MD  ibuprofen (ADVIL,MOTRIN) 200 MG tablet Take 800 mg by mouth every 6 (six) hours as needed for headache or moderate pain.    Historical Provider, MD  lisdexamfetamine (VYVANSE) 60 MG capsule Take 70 mg by mouth daily. Reported  on 06/20/2015    Historical Provider, MD  loratadine (CLARITIN) 10 MG tablet Take 10 mg by mouth daily.    Historical Provider, MD  LORazepam (ATIVAN) 1 MG tablet Take 1 tablet at onset of any anxiety attack. Patient taking differently: Take 1 mg by mouth daily as needed (anxiety attacks). Take 1 tablet at onset of any anxiety attack. 07/13/14   Collene Gobble, MD  naproxen (NAPROSYN) 500 MG tablet Take 1 tablet (500 mg total) by mouth 2 (two) times daily with a meal. 10/15/15    Everlene Farrier, PA-C  omeprazole (PRILOSEC) 10 MG capsule Take 1 capsule (10 mg total) by mouth daily. Okay to fill in 40 days from original prescription date. Patient not taking: Reported on 06/24/2015 06/20/15   Ofilia Neas, PA-C  omeprazole (PRILOSEC) 20 MG capsule TAKE 1 CAPSULE (20 MG TOTAL) BY MOUTH DAILY. 07/31/15   Ofilia Neas, PA-C  ondansetron (ZOFRAN ODT) 4 MG disintegrating tablet 4mg  ODT q4 hours prn nausea/vomit 06/24/15   Bethann Berkshire, MD  Probiotic Product (PHILLIPS COLON HEALTH PO) Take 1 tablet by mouth once.    Historical Provider, MD  risperiDONE (RISPERDAL) 0.5 MG tablet Take 0.5 mg by mouth daily.     Historical Provider, MD  tamsulosin (FLOMAX) 0.4 MG CAPS capsule Take 1 capsule (0.4 mg total) by mouth daily. 06/24/15   Bethann Berkshire, MD  VYVANSE 70 MG capsule Take 70 mg by mouth daily. 05/31/15   Historical Provider, MD    Family History Family History  Problem Relation Age of Onset  . Adopted: Yes    Social History Social History  Substance Use Topics  . Smoking status: Never Smoker  . Smokeless tobacco: Not on file  . Alcohol use No     Allergies   Review of patient's allergies indicates no known allergies.   Review of Systems Review of Systems  Constitutional: Negative for fever.  Cardiovascular: Negative for leg swelling.  Gastrointestinal: Negative for abdominal pain, nausea and vomiting.  Genitourinary: Negative for difficulty urinating, dysuria, flank pain and frequency.  Musculoskeletal: Positive for back pain (left lower). Negative for joint swelling.  Skin: Negative for color change, rash and wound.  Neurological: Negative for weakness and numbness.       No tingling     Physical Exam Updated Vital Signs BP 141/82 (BP Location: Left Arm)   Pulse 99   Temp 98.2 F (36.8 C) (Oral)   Resp 18   SpO2 100%   Physical Exam  Constitutional: He appears well-developed and well-nourished. No distress.  Non-toxic appearing.  HENT:  Head:  Normocephalic and atraumatic.  Eyes: Conjunctivae are normal. Pupils are equal, round, and reactive to light. Right eye exhibits no discharge. Left eye exhibits no discharge.  Neck: Neck supple.  Cardiovascular: Normal rate, regular rhythm, normal heart sounds and intact distal pulses.   Heart rate is 88  Pulmonary/Chest: Effort normal and breath sounds normal. No respiratory distress.  Abdominal: Soft. There is no tenderness.  Musculoskeletal: Normal range of motion. He exhibits no edema, tenderness or deformity.  No midline neck or back tenderness. No deformity, edema, ecchymosis, erythema, or warmth to back. Good strength to BLE. No LE edema or tenderness.   Lymphadenopathy:    He has no cervical adenopathy.  Neurological: He is alert. He has normal reflexes. He displays normal reflexes. Coordination normal.  Bilateral patellar DTRs intact. Nl gait. Sensation intact to BLE.   Skin: Skin is warm and dry. Capillary refill takes  less than 2 seconds. No rash noted. He is not diaphoretic. No erythema. No pallor.  Psychiatric: He has a normal mood and affect. His behavior is normal.  Nursing note and vitals reviewed.    ED Treatments / Results  DIAGNOSTIC STUDIES: Oxygen Saturation is 100% on RA, nl by my interpretation.    COORDINATION OF CARE: 7:46 PM Discussed treatment plan with pt at bedside which includes diclofenac gel Rx, naprosyn Rx, back exercises and pt agreed to plan.  Procedures Procedures (including critical care time)  Medications Ordered in ED Medications - No data to display   Initial Impression / Assessment and Plan / ED Course  I have reviewed the triage vital signs and the nursing notes.   Clinical Course     Patient presents to the ED with back pain since he was a child from scoliosis. This has worsened at his new job. No neurological deficits and normal neuro exam. No current back pain.  Patient is ambulatory.  No loss of bowel or bladder control.  No  concern for cauda equina.  No fever, night sweats, weight loss, h/o cancer, IVDA, no recent procedure to back. No urinary symptoms suggestive of UTI.  Supportive care and return precaution discussed. I discussed back exercises and proper lifting techniques. Appears safe for discharge at this time. Naproxen and diclofenac gel for pain. I encouraged him to follow-up with primary care for further evaluation. I advised the patient to follow-up with their primary care provider this week. I advised the patient to return to the emergency department with new or worsening symptoms or new concerns. The patient verbalized understanding and agreement with plan.    Final Clinical Impressions(s) / ED Diagnoses   Final diagnoses:  Low back pain, unspecified back pain laterality, with sciatica presence unspecified    New Prescriptions New Prescriptions   DICLOFENAC SODIUM 3 % GEL    Place 1 application onto the skin 2 (two) times daily. To the affected area.   NAPROXEN (NAPROSYN) 500 MG TABLET    Take 1 tablet (500 mg total) by mouth 2 (two) times daily with a meal.   I personally performed the services described in this documentation, which was scribed in my presence. The recorded information has been reviewed and is accurate.       Everlene Farrier, PA-C 10/15/15 1958    Nira Conn, MD 10/16/15 [phone]

## 2017-06-11 IMAGING — CR DG ABDOMEN 1V
1 series · 1 of 1 positions shown · non-contrast
Comparison: 10/30/2006

CLINICAL DATA: Dysuria and hematuria.

EXAM:
ABDOMEN - 1 VIEW

[AP]
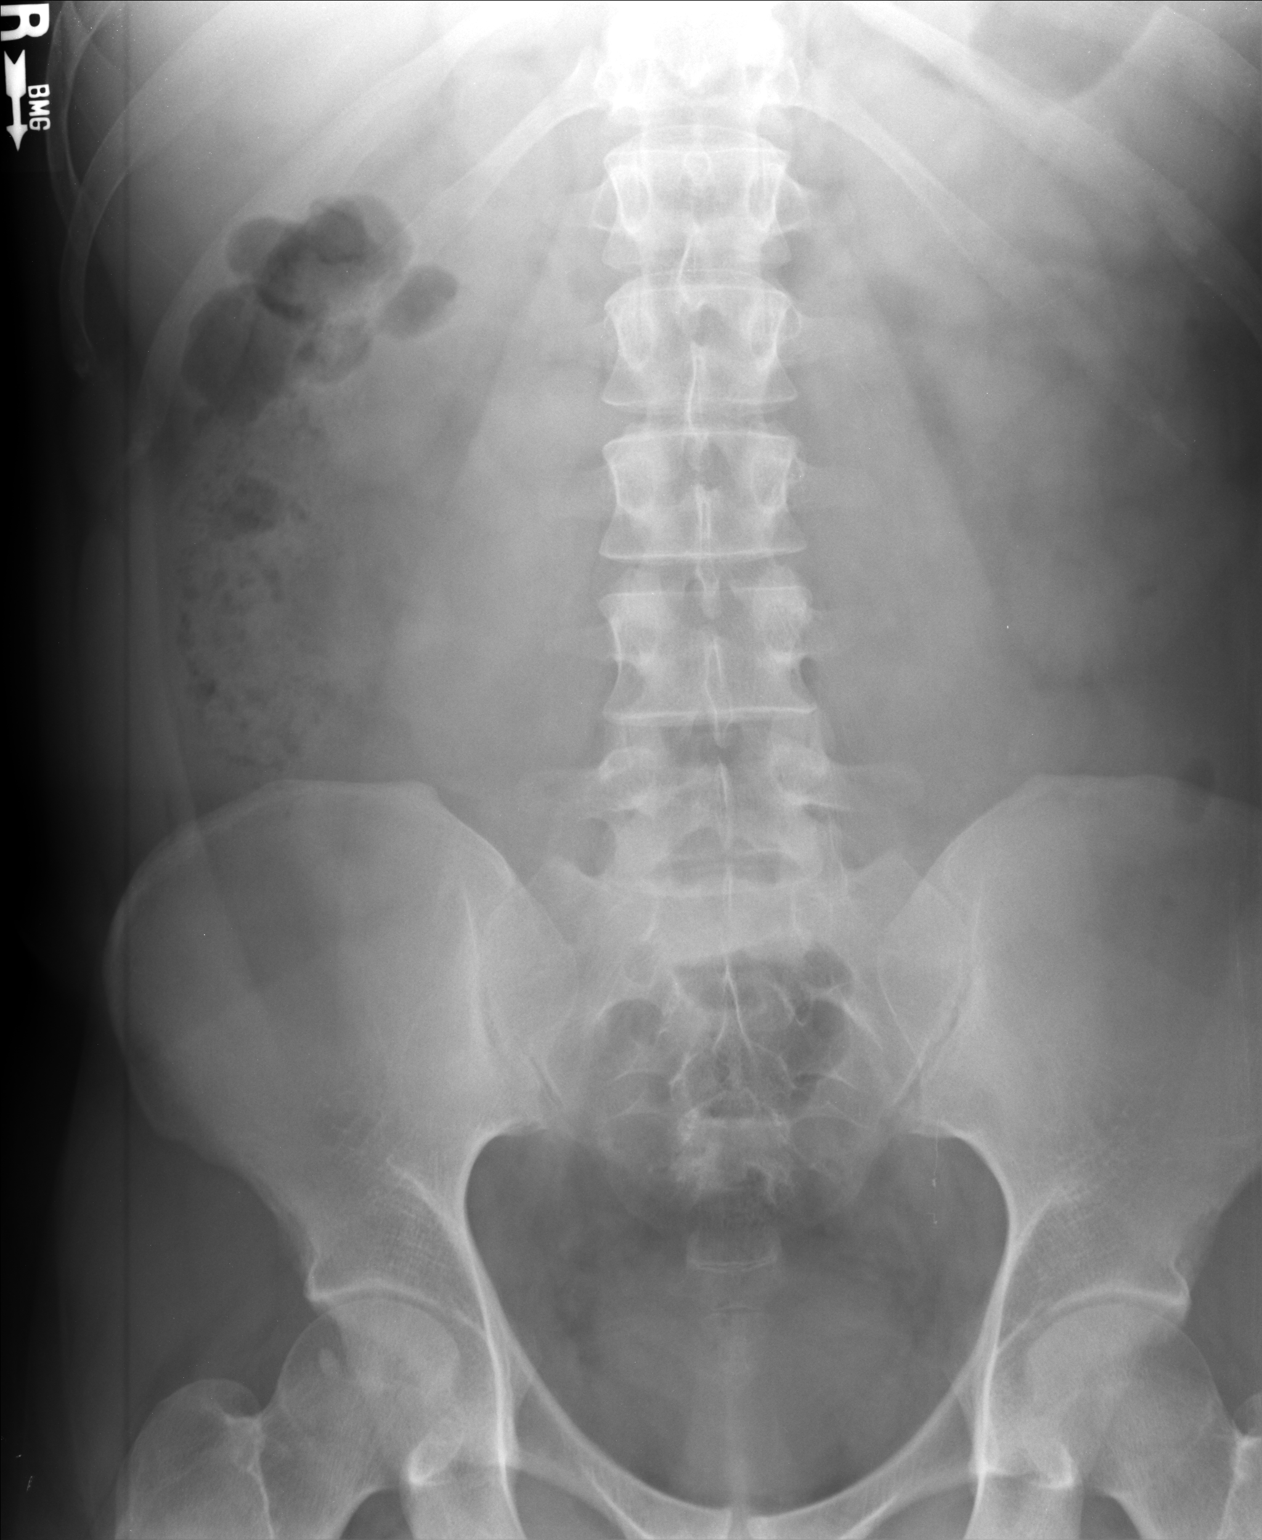

[1 of 1 positions shown; findings below may reference images not displayed]

FINDINGS: No abnormal stool retention or obstruction. No visible urinary
calculus. The questioned right renal calculus on the previous study
could be obscured by bowel gas. No concerning intra-abdominal mass
effect. Negative visualized skeleton.
IMPRESSION: Negative study.  No evidence of urolithiasis.

## 2019-03-21 ENCOUNTER — Encounter (HOSPITAL_COMMUNITY): Payer: Self-pay | Admitting: Emergency Medicine

## 2019-03-21 ENCOUNTER — Emergency Department (HOSPITAL_COMMUNITY): Payer: Federal, State, Local not specified - PPO

## 2019-03-21 ENCOUNTER — Other Ambulatory Visit: Payer: Self-pay

## 2019-03-21 ENCOUNTER — Emergency Department (HOSPITAL_COMMUNITY)
Admission: EM | Admit: 2019-03-21 | Discharge: 2019-03-21 | Disposition: A | Discharge status: Home / Self Care | Payer: Federal, State, Local not specified - PPO | Attending: Emergency Medicine | Admitting: Emergency Medicine

## 2019-03-21 DIAGNOSIS — F17200 Nicotine dependence, unspecified, uncomplicated: Secondary | ICD-10-CM | POA: Diagnosis not present

## 2019-03-21 DIAGNOSIS — Z79899 Other long term (current) drug therapy: Secondary | ICD-10-CM | POA: Diagnosis not present

## 2019-03-21 DIAGNOSIS — R509 Fever, unspecified: Secondary | ICD-10-CM | POA: Insufficient documentation

## 2019-03-21 DIAGNOSIS — K921 Melena: Secondary | ICD-10-CM | POA: Insufficient documentation

## 2019-03-21 DIAGNOSIS — K602 Anal fissure, unspecified: Secondary | ICD-10-CM | POA: Insufficient documentation

## 2019-03-21 DIAGNOSIS — R1032 Left lower quadrant pain: Secondary | ICD-10-CM

## 2019-03-21 DIAGNOSIS — R197 Diarrhea, unspecified: Secondary | ICD-10-CM | POA: Diagnosis not present

## 2019-03-21 DIAGNOSIS — F845 Asperger's syndrome: Secondary | ICD-10-CM | POA: Diagnosis not present

## 2019-03-21 DIAGNOSIS — Z20822 Contact with and (suspected) exposure to covid-19: Secondary | ICD-10-CM | POA: Insufficient documentation

## 2019-03-21 DIAGNOSIS — K645 Perianal venous thrombosis: Secondary | ICD-10-CM | POA: Insufficient documentation

## 2019-03-21 LAB — COMPREHENSIVE METABOLIC PANEL
ALT: 59 U/L — ABNORMAL HIGH (ref 0–44)
AST: 38 U/L (ref 15–41)
Albumin: 4 g/dL (ref 3.5–5.0)
Alkaline Phosphatase: 74 U/L (ref 38–126)
Anion gap: 10 (ref 5–15)
BUN: 22 mg/dL — ABNORMAL HIGH (ref 6–20)
CO2: 24 mmol/L (ref 22–32)
Calcium: 9.4 mg/dL (ref 8.9–10.3)
Chloride: 106 mmol/L (ref 98–111)
Creatinine, Ser: 1.26 mg/dL — ABNORMAL HIGH (ref 0.61–1.24)
GFR calc Af Amer: >60 mL/min (ref >60)
GFR calc non Af Amer: >60 mL/min (ref >60)
Glucose, Bld: 131 mg/dL — ABNORMAL HIGH (ref 70–99)
Potassium: 4 mmol/L (ref 3.5–5.1)
Sodium: 140 mmol/L (ref 135–145)
Total Bilirubin: 1.1 mg/dL (ref 0.3–1.2)
Total Protein: 7 g/dL (ref 6.5–8.1)

## 2019-03-21 LAB — POC SARS CORONAVIRUS 2 AG -  ED: SARS Coronavirus 2 Ag: NEGATIVE

## 2019-03-21 LAB — POC OCCULT BLOOD, ED: Fecal Occult Bld: POSITIVE — AB

## 2019-03-21 LAB — C DIFFICILE QUICK SCREEN W PCR REFLEX
C Diff antigen: NEGATIVE
C Diff interpretation: NOT DETECTED
C Diff toxin: NEGATIVE

## 2019-03-21 LAB — CBC
HCT: 45.7 % (ref 39.0–52.0)
Hemoglobin: 15.5 g/dL (ref 13.0–17.0)
MCH: 28.1 pg (ref 26.0–34.0)
MCHC: 33.9 g/dL (ref 30.0–36.0)
MCV: 82.9 fL (ref 80.0–100.0)
Platelets: 211 10*3/uL (ref 150–400)
RBC: 5.51 MIL/uL (ref 4.22–5.81)
RDW: 12 % (ref 11.5–15.5)
WBC: 5.5 10*3/uL (ref 4.0–10.5)
nRBC: 0 % (ref 0.0–0.2)

## 2019-03-21 MED ORDER — IOHEXOL 300 MG/ML  SOLN
100.0000 mL | Freq: Once | INTRAMUSCULAR | Status: AC | PRN
  Administered 2019-03-21: 10:00:00 100 mL via INTRAVENOUS

## 2019-03-21 MED ORDER — SODIUM CHLORIDE 0.9 % IV BOLUS
1000.0000 mL | Freq: Once | INTRAVENOUS | Status: AC
  Administered 2019-03-21: 10:00:00 1000 mL via INTRAVENOUS

## 2019-03-21 NOTE — ED Triage Notes (Signed)
Pt. Stated, Ive had blood in my crap for the last 3 days and some abdominal cramping.

## 2019-03-21 NOTE — ED Notes (Signed)
Pt ambulated to bathroom, urine sample and culture collected

## 2019-03-21 NOTE — ED Provider Notes (Signed)
[facility] EMERGENCY DEPARTMENT Provider Note   CSN: [id number] Arrival date & time: 03/21/19  2130     History Chief Complaint  Patient presents with  . Rectal Bleeding  . Abdominal Pain    Juan Weber is a 24 y.o. male with history of Asperger's disorder, GERD, PUD who presents with rectal bleeding and abdominal pain.  He states over the past 3 days he has had bloody diarrhea.  He estimates he has had between 10 and 20 episodes of diarrhea daily.  He reports associated subjective fever and left-sided abdominal pain.  He has difficulty describing the pain and states that it feels achy.  He initially thought it was a kidney stone because it was severe couple days ago and went away however then he started to have the bloody diarrhea.  The blood is dark red.  He will continue to have bleeding for about 5 minutes after having a bowel movement.  He states he has an anal fissure and rectal discomfort from wiping so much.  He was previously on a PPI but has not been taking this recently.  Patient is adopted is unsure of any family history.  Denies history of IBD.  He drinks alcohol regularly.  He states he drinks 2 to 3 cups of liquor every other day.  Today he felt lightheaded and therefore decided to come to the ED.  HPI     Past Medical History:  Diagnosis Date  . Allergy   . Anxiety   . Asperger's disorder   . Chest pain    [facility] The pt exercised according to Bruce Protocol, Total time recorded 6:58 min achieving max heart rate of 240 which was 120% of MPHR for age and 8.75 METS of work. Normal BP response. Resting ECG showing sinus tach. There was no ST-T changes of ischemia with exercise stress test. No arrythmias noted.   . Depression   . GERD (gastroesophageal reflux disease)   . Scoliosis   . Ulcer     Patient Active Problem List   Diagnosis Date Noted  . Asperger's syndrome 02/21/2011    History reviewed. No pertinent surgical  history.     Family History  Adopted: Yes    Social History   Tobacco Use  . Smoking status: Current Every Day Smoker  . Smokeless tobacco: Never Used  Substance Use Topics  . Alcohol use: Yes    Alcohol/week: 0.0 standard drinks  . Drug use: No    Home Medications Prior to Admission medications   Medication Sig Start Date End Date Taking? Authorizing Provider  amoxicillin-clarithromycin-lansoprazole [facility]) combo pack Take by mouth 2 (two) times daily. Follow package directions. Stop taking omeprazole once staring this regimen. DO NOT MISS DOSES. Patient taking differently: Take by mouth 2 (two) times daily. Follow package directions. Stop taking omeprazole once staring this regimen. DO NOT MISS DOSES.  Lansoprazole 30mg  BID; CLarithromycin 500mg  BID; and Amoxicillin 500mg  2 tablets (1000mg ) twice daily 06/21/15   , PA-C  buPROPion (WELLBUTRIN XL) 300 MG 24 hr tablet Take 300 mg by mouth daily.    [provider]  Diclofenac Sodium 3 % GEL Place 1 application onto the skin 2 (two) times daily. To the affected area. 10/15/15   , PA-C  HYDROcodone-acetaminophen (NORCO/VICODIN) 5-325 MG tablet Take 1 tablet by mouth every 6 (six) hours as needed for moderate pain. 06/24/15   Ofilia Neas, MD  ibuprofen (ADVIL,MOTRIN) 200 MG tablet Take 800 mg  by mouth every 6 (six) hours as needed for headache or moderate pain.    [provider]  lisdexamfetamine (VYVANSE) 60 MG capsule Take 70 mg by mouth daily. Reported on 06/20/2015    [provider]  loratadine (CLARITIN) 10 MG tablet Take 10 mg by mouth daily.    [provider]  LORazepam (ATIVAN) 1 MG tablet Take 1 tablet at onset of any anxiety attack. Patient taking differently: Take 1 mg by mouth daily as needed (anxiety attacks). Take 1 tablet at onset of any anxiety attack. 07/13/14   Darlyne Russian, MD  naproxen (NAPROSYN) 500 MG tablet Take 1 tablet (500 mg total) by  mouth 2 (two) times daily with a meal. 10/15/15   Waynetta Pean, PA-C  omeprazole (PRILOSEC) 10 MG capsule Take 1 capsule (10 mg total) by mouth daily. Okay to fill in 40 days from original prescription date. Patient not taking: Reported on 06/24/2015 06/20/15   Tereasa Coop, PA-C  omeprazole (PRILOSEC) 20 MG capsule TAKE 1 CAPSULE (20 MG TOTAL) BY MOUTH DAILY. 07/31/15   Tereasa Coop, PA-C  ondansetron (ZOFRAN ODT) 4 MG disintegrating tablet 4mg  ODT q4 hours prn nausea/vomit 06/24/15   Milton Ferguson, MD  Probiotic Product ([place]) Take 1 tablet by mouth once.    [provider]  risperiDONE (RISPERDAL) 0.5 MG tablet Take 0.5 mg by mouth daily.     [provider]  tamsulosin (FLOMAX) 0.4 MG CAPS capsule Take 1 capsule (0.4 mg total) by mouth daily. 06/24/15   Milton Ferguson, MD  VYVANSE 70 MG capsule Take 70 mg by mouth daily. 05/31/15   [provider]    Allergies    Patient has no known allergies.  Review of Systems   Review of Systems  Constitutional: Positive for appetite change and fever.  HENT: Negative for rhinorrhea and sore throat.   Respiratory: Negative for cough and shortness of breath.   Cardiovascular: Negative for chest pain.  Gastrointestinal: Positive for abdominal pain, diarrhea and nausea. Negative for vomiting.  Genitourinary: Positive for flank pain (resolved). Negative for dysuria and hematuria.  Neurological: Positive for light-headedness.  All other systems reviewed and are negative.   Physical Exam Updated Vital Signs BP (!) 138/98 (BP Location: Right Arm)   Pulse 85   Temp (!) 97.5 F (36.4 C) (Oral)   Resp 14   Ht 5\' 6"  (1.676 m)   Wt 108.9 kg   SpO2 95%   BMI 38.74 kg/m   Physical Exam Vitals and nursing note reviewed.  Constitutional:      General: He is not in acute distress.    Appearance: He is well-developed. He is obese. He is not ill-appearing.     Comments: Calm and cooperative. NAD  HENT:      Head: Normocephalic and atraumatic.  Eyes:     General: No scleral icterus.       Right eye: No discharge.        Left eye: No discharge.     Conjunctiva/sclera: Conjunctivae normal.     Pupils: Pupils are equal, round, and reactive to light.  Cardiovascular:     Rate and Rhythm: Normal rate and regular rhythm.  Pulmonary:     Effort: Pulmonary effort is normal. No respiratory distress.     Breath sounds: Normal breath sounds.  Abdominal:     General: Abdomen is protuberant. There is no distension.     Palpations: Abdomen is soft.  Tenderness: There is abdominal tenderness (LUQ and LLQ tenderness).  Genitourinary:    Comments: Rectal: Anus is erythematous and irritated appearing. Anal fissure in the 9 o clock region. Non-thrombosed hemorrhoid in the 6 o clock region. No gross blood or melena seen with digital exam Musculoskeletal:     Cervical back: Normal range of motion.  Skin:    General: Skin is warm and dry.  Neurological:     Mental Status: He is alert and oriented to person, place, and time.  Psychiatric:        Behavior: Behavior normal.     ED Results / Procedures / Treatments   Labs (all labs ordered are listed, but only abnormal results are displayed) Labs Reviewed  COMPREHENSIVE METABOLIC PANEL - Abnormal; Notable for the following components:      Result Value   Glucose, Bld 131 (*)    BUN 22 (*)    Creatinine, Ser 1.26 (*)    ALT 59 (*)    All other components within normal limits  POC OCCULT BLOOD, ED - Abnormal; Notable for the following components:   Fecal Occult Bld POSITIVE (*)    All other components within normal limits  C DIFFICILE QUICK SCREEN W PCR REFLEX  GI PATHOGEN PANEL BY PCR, STOOL  CBC  POC SARS CORONAVIRUS 2 AG -  ED    EKG None  Radiology CT Abdomen Pelvis W Contrast  Result Date: 03/21/2019 CLINICAL DATA:  Left lower quadrant abdominal pain EXAM: CT ABDOMEN AND PELVIS WITH CONTRAST TECHNIQUE: Multidetector CT imaging  of the abdomen and pelvis was performed using the standard protocol following bolus administration of intravenous contrast. CONTRAST:  OMNIPAQUE IOHEXOL 300 MG/ML  SOLN COMPARISON:  06/24/2015 FINDINGS: Lower chest: No acute abnormality. Hepatobiliary: No solid liver abnormality is seen. Hepatic steatosis. No gallstones, gallbladder wall thickening, or biliary dilatation. Pancreas: Unremarkable. No pancreatic ductal dilatation or surrounding inflammatory changes. Spleen: Normal in size without significant abnormality. Adrenals/Urinary Tract: Adrenal glands are unremarkable. Kidneys are normal, without renal calculi, solid lesion, or hydronephrosis. Bladder is unremarkable. Stomach/Bowel: Stomach is within normal limits. Fatty mural stratification of of the terminal ileum (series 3, image 52), and the rectum (series 3, image 83, series 7, image 69). Appendix appears normal. No evidence of bowel wall thickening, distention, or inflammatory changes. Vascular/Lymphatic: No significant vascular findings are present. No enlarged abdominal or pelvic lymph nodes. Reproductive: No mass or other significant abnormality. Other: No abdominal wall hernia or abnormality. No abdominopelvic ascites. Musculoskeletal: No acute or significant osseous findings. IMPRESSION: 1. No acute CT findings of the abdomen or pelvis to explain left lower quadrant pain. 2. Fatty mural stratification of the terminal ileum and rectum may represent chronic sequela of prior infection or inflammation, including inflammatory bowel disease such as Crohn's disease. Findings are similar to prior. Correlate with clinical history, if present. No evidence of active inflammation. 3. Hepatic steatosis. Electronically Signed   By: Lauralyn Primes M.D.   On: 03/21/2019 10:21    Procedures Procedures (including critical care time)  Medications Ordered in ED Medications  sodium chloride 0.9 % bolus 1,000 mL (0 mLs Intravenous Stopped 03/21/19 1155)    iohexol (OMNIPAQUE) 300 MG/ML solution 100 mL (100 mLs Intravenous Contrast Given 03/21/19 1014)    ED Course  I have reviewed the triage vital signs and the nursing notes.  Pertinent labs & imaging results that were available during my care of the patient were reviewed by me and considered in my medical decision  making (see chart for details).  24 year old male presents with watery bloody diarrhea for 3 days with LLQ abdominal pain and subjective fevers. DDX includes acute GI infection vs inflammatory process. Pt has a hx of GERD and PUD from H. Pylori but has never had rectal bleeding. BP is elevated and otherwise vitals are normal.  Heart is regular rate and rhythm.  Lungs are clear to auscultation.  Abdomen is soft tender over the left abdomen.  Rectal exam shows irritated anus but no gross blood or melena.  Hemoccult was positive here.  CBC is normal.  CMP is remarkable for mild AKI and therefore he is given a fluid bolus.  Patient had an episode of bloody diarrhea here and C. difficile test was run and is negative although it is unclear if this was an adequate sample since nursing report that there was minimal stool in the sample.  Patient has had no further episodes of bleeding after this. CT shows "fatty mural stratification of the terminal ileum and rectum may represent chronic sequela of prior infection or inflammation. No evidence of active inflammation"   Since he is overall well-appearing, hemodynamically stable, and has normal blood counts he can follow-up as an outpatient.  Discussed with Dr. Ewing Schlein with [facility] who advised to call the office tomorrow for an appointment.  MDM Rules/Calculators/A&P                       Final Clinical Impression(s) / ED Diagnoses Final diagnoses:  Bloody diarrhea  LLQ abdominal pain    Rx / DC Orders ED Discharge Orders    None       Bethel Born, PA-C 03/21/19 1223    Terrilee Files, MD 03/21/19 [id number]

## 2019-03-21 NOTE — Discharge Instructions (Addendum)
Call Monday for an appointment Gastroenterology

## 2019-05-08 ENCOUNTER — Other Ambulatory Visit: Payer: Self-pay

## 2019-05-08 ENCOUNTER — Emergency Department (HOSPITAL_COMMUNITY)
Admission: EM | Admit: 2019-05-08 | Discharge: 2019-05-09 | Disposition: A | Discharge status: Home / Self Care | Payer: Federal, State, Local not specified - PPO | Attending: Emergency Medicine | Admitting: Emergency Medicine

## 2019-05-08 ENCOUNTER — Encounter (HOSPITAL_COMMUNITY): Payer: Self-pay

## 2019-05-08 DIAGNOSIS — Z20822 Contact with and (suspected) exposure to covid-19: Secondary | ICD-10-CM | POA: Diagnosis not present

## 2019-05-08 DIAGNOSIS — Z79899 Other long term (current) drug therapy: Secondary | ICD-10-CM | POA: Diagnosis not present

## 2019-05-08 DIAGNOSIS — F333 Major depressive disorder, recurrent, severe with psychotic symptoms: Secondary | ICD-10-CM | POA: Insufficient documentation

## 2019-05-08 DIAGNOSIS — F845 Asperger's syndrome: Secondary | ICD-10-CM | POA: Diagnosis not present

## 2019-05-08 DIAGNOSIS — R45851 Suicidal ideations: Secondary | ICD-10-CM | POA: Insufficient documentation

## 2019-05-08 LAB — ACETAMINOPHEN LEVEL: Acetaminophen (Tylenol), Serum: <10 ug/mL — ABNORMAL LOW (ref 10–30)

## 2019-05-08 LAB — RESPIRATORY PANEL BY RT PCR (FLU A&B, COVID)
Influenza A by PCR: NEGATIVE
Influenza B by PCR: NEGATIVE
SARS Coronavirus 2 by RT PCR: NEGATIVE

## 2019-05-08 LAB — COMPREHENSIVE METABOLIC PANEL
ALT: 32 U/L (ref 0–44)
AST: 23 U/L (ref 15–41)
Albumin: 4.2 g/dL (ref 3.5–5.0)
Alkaline Phosphatase: 71 U/L (ref 38–126)
Anion gap: 12 (ref 5–15)
BUN: 12 mg/dL (ref 6–20)
CO2: 24 mmol/L (ref 22–32)
Calcium: 9.4 mg/dL (ref 8.9–10.3)
Chloride: 105 mmol/L (ref 98–111)
Creatinine, Ser: 0.95 mg/dL (ref 0.61–1.24)
GFR calc Af Amer: >60 mL/min (ref >60)
GFR calc non Af Amer: >60 mL/min (ref >60)
Glucose, Bld: 94 mg/dL (ref 70–99)
Potassium: 3.5 mmol/L (ref 3.5–5.1)
Sodium: 141 mmol/L (ref 135–145)
Total Bilirubin: 0.8 mg/dL (ref 0.3–1.2)
Total Protein: 7.4 g/dL (ref 6.5–8.1)

## 2019-05-08 LAB — CBC WITH DIFFERENTIAL/PLATELET
Abs Immature Granulocytes: 0.03 10*3/uL (ref 0.00–0.07)
Basophils Absolute: 0 10*3/uL (ref 0.0–0.1)
Basophils Relative: 0 %
Eosinophils Absolute: 0.2 10*3/uL (ref 0.0–0.5)
Eosinophils Relative: 4 %
HCT: 44 % (ref 39.0–52.0)
Hemoglobin: 14.7 g/dL (ref 13.0–17.0)
Immature Granulocytes: 1 %
Lymphocytes Relative: 26 %
Lymphs Abs: 1.6 10*3/uL (ref 0.7–4.0)
MCH: 27.8 pg (ref 26.0–34.0)
MCHC: 33.4 g/dL (ref 30.0–36.0)
MCV: 83.3 fL (ref 80.0–100.0)
Monocytes Absolute: 0.5 10*3/uL (ref 0.1–1.0)
Monocytes Relative: 9 %
Neutro Abs: 3.6 10*3/uL (ref 1.7–7.7)
Neutrophils Relative %: 60 %
Platelets: 119 10*3/uL — ABNORMAL LOW (ref 150–400)
RBC: 5.28 MIL/uL (ref 4.22–5.81)
RDW: 12.1 % (ref 11.5–15.5)
WBC: 5.9 10*3/uL (ref 4.0–10.5)
nRBC: 0 % (ref 0.0–0.2)

## 2019-05-08 LAB — RAPID URINE DRUG SCREEN, HOSP PERFORMED
Amphetamines: NOT DETECTED
Barbiturates: NOT DETECTED
Benzodiazepines: NOT DETECTED
Cocaine: NOT DETECTED
Opiates: NOT DETECTED
Tetrahydrocannabinol: NOT DETECTED

## 2019-05-08 LAB — SALICYLATE LEVEL: Salicylate Lvl: <7 mg/dL — ABNORMAL LOW (ref 7.0–30.0)

## 2019-05-08 LAB — ETHANOL: Alcohol, Ethyl (B): 46 mg/dL — ABNORMAL HIGH (ref <10)

## 2019-05-08 MED ORDER — BUPROPION HCL ER (XL) 150 MG PO TB24
300.0000 mg | ORAL_TABLET | Freq: Every day | ORAL | Status: DC
  Administered 2019-05-09: 300 mg via ORAL
  Filled 2019-05-08: qty 2

## 2019-05-08 MED ORDER — LISDEXAMFETAMINE DIMESYLATE 70 MG PO CAPS: 70.0000 mg | ORAL_CAPSULE | Freq: Every day | ORAL | Status: DC

## 2019-05-08 MED ORDER — RISPERIDONE 0.5 MG PO TABS: 0.5000 mg | ORAL_TABLET | Freq: Every day | ORAL | Status: DC

## 2019-05-08 MED ORDER — LORATADINE 10 MG PO TABS: 10.0000 mg | ORAL_TABLET | Freq: Every day | ORAL | Status: DC

## 2019-05-08 MED ORDER — COLCHICINE 0.6 MG PO TABS: 0.6000 mg | ORAL_TABLET | Freq: Every day | ORAL | Status: DC

## 2019-05-08 MED ORDER — IBUPROFEN 800 MG PO TABS: 800.0000 mg | ORAL_TABLET | Freq: Four times a day (QID) | ORAL | Status: DC | PRN

## 2019-05-08 MED ORDER — INDOMETHACIN ER 75 MG PO CPCR
75.0000 mg | ORAL_CAPSULE | Freq: Two times a day (BID) | ORAL | Status: DC
  Administered 2019-05-09: 75 mg via ORAL
  Filled 2019-05-08 (×3): qty 1

## 2019-05-08 MED ORDER — CLARITHROMYCIN 250 MG PO TABS
500.0000 mg | ORAL_TABLET | Freq: Two times a day (BID) | ORAL | Status: DC
  Administered 2019-05-09: 500 mg via ORAL
  Filled 2019-05-08 (×3): qty 2

## 2019-05-08 MED ORDER — AMOXICILLIN 500 MG PO CAPS
500.0000 mg | ORAL_CAPSULE | Freq: Two times a day (BID) | ORAL | Status: DC
  Administered 2019-05-09: 500 mg via ORAL
  Filled 2019-05-08 (×3): qty 1

## 2019-05-08 MED ORDER — GABAPENTIN 100 MG PO CAPS
100.0000 mg | ORAL_CAPSULE | Freq: Every day | ORAL | Status: DC
  Administered 2019-05-09: 100 mg via ORAL
  Filled 2019-05-08: qty 1

## 2019-05-08 MED ORDER — HYDROXYZINE HCL 25 MG PO TABS
25.0000 mg | ORAL_TABLET | Freq: Once | ORAL | Status: AC
  Administered 2019-05-08: 25 mg via ORAL
  Filled 2019-05-08: qty 1

## 2019-05-08 MED ORDER — ESCITALOPRAM OXALATE 10 MG PO TABS
10.0000 mg | ORAL_TABLET | Freq: Every day | ORAL | Status: DC
  Administered 2019-05-09: 10 mg via ORAL
  Filled 2019-05-08: qty 1

## 2019-05-08 NOTE — ED Triage Notes (Signed)
Patient states he is suicidal and has several plans,but wants to get help. Patient stated, "I feel defeated."  Patient denies any HI. Patient states he last drank a can of beer at 0800 today. Patient states he also takes CBD.

## 2019-05-08 NOTE — BH Assessment (Signed)
[facility] Assessment Progress Note  Case was staffed with [place] NP who recommended patient be observed and monitored.    
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
## 2019-05-09 DIAGNOSIS — F845 Asperger's syndrome: Secondary | ICD-10-CM

## 2019-05-09 MED ORDER — HYDROXYZINE HCL 25 MG PO TABS
25.0000 mg | ORAL_TABLET | Freq: Once | ORAL | Status: AC
  Administered 2019-05-09: 25 mg via ORAL
  Filled 2019-05-09: qty 1

## 2019-05-09 NOTE — ED Notes (Signed)
Pt discharged home. Discharged instructions read to pt who verbalized understanding. All belongings returned to pt who signed for same. Denies SI/HI, is not delusional and not responding to internal stimuli. Escorted pt to the ED exit.    
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
## 2019-05-09 NOTE — Progress Notes (Signed)
CSW faxed outpatient mental health and substance use resources to [facility] fax [phone].    [name] MSW, LCSWA Clincal Social Worker Disposition  [facility] Ph: [phone] Fax: [phone]
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
## 2019-05-09 NOTE — Discharge Instructions (Addendum)
Follow up with the behavioral health resources that are given.

## 2019-05-09 NOTE — ED Notes (Addendum)
Calm and cooperative. Wants to go to work tomorrow, but feels like he needs some help. Denies SI/HI/AVH.   Of medications he does not want to take the Vyvanse because his insurance does not cover it. Also does not want Claritin.

## 2019-05-09 NOTE — ED Provider Notes (Signed)
Behavioral health team evaluated the patient, feels he is safe for discharge.  Recommends outpatient follow-up.  Resources faxed.   Alveria Apley, PA-C 05/09/19 1232    Raeford Razor, MD 05/12/19 [phone]

## 2019-05-09 NOTE — ED Notes (Signed)
Pt states that he has a problem keeping jobs because he has to miss work due to his anxiety and PTSD.  He said, "I need government assistance."

## 2019-08-31 ENCOUNTER — Ambulatory Visit (HOSPITAL_COMMUNITY)
Admission: EM | Admit: 2019-08-31 | Discharge: 2019-08-31 | Disposition: A | Discharge status: Home / Self Care | Payer: Federal, State, Local not specified - PPO

## 2019-08-31 ENCOUNTER — Other Ambulatory Visit: Payer: Self-pay

## 2019-08-31 DIAGNOSIS — F332 Major depressive disorder, recurrent severe without psychotic features: Secondary | ICD-10-CM

## 2019-08-31 DIAGNOSIS — F322 Major depressive disorder, single episode, severe without psychotic features: Secondary | ICD-10-CM

## 2019-08-31 DIAGNOSIS — F411 Generalized anxiety disorder: Secondary | ICD-10-CM | POA: Diagnosis not present

## 2019-08-31 MED ORDER — ESCITALOPRAM OXALATE 20 MG PO TABS: 20.0000 mg | ORAL_TABLET | Freq: Every day | ORAL | 1 refills | Status: AC

## 2019-08-31 MED ORDER — HYDROXYZINE PAMOATE 25 MG PO CAPS: 25.0000 mg | ORAL_CAPSULE | Freq: Three times a day (TID) | ORAL | 1 refills | Status: AC | PRN

## 2019-08-31 NOTE — Progress Notes (Signed)
Kalev received his AVS, his questions were answered and he was escorted to the lobby without incident. He did not have any personal belongings to be retrieved.

## 2019-08-31 NOTE — Discharge Instructions (Addendum)
Take medications as prescribed: Risperdal at Bedtime Start Vistaril 25 mg 3 times a day as needed Take Lexapro 20 mg by mouth daily
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
## 2019-08-31 NOTE — ED Provider Notes (Signed)
Behavioral Health Medical Screening Exam  Juan Weber is a 24 y.o. male.  Patient presents as a walk-in to the [facility] reporting that he needs to have his medications adjusted.  He reports that he is taking Wellbutrin 300 mg p.o. daily, Lexapro 10 mg p.o. daily, and Risperdal 4 mg p.o. daily.  He reports he has a diagnostic history of ADD, autism spectrum disorder, and MDD.  He states that he was at [facility] in [place] approximately 1 to 2 months ago.  He states he has had no follow-up appointments with behavioral health.  He states that he is living with his parents and that he is on their insurance and has [employer], but he stated that they were not willing to pay for his visits for outpatient.  He does report increased anxiety as well as increasing depression.  He states that he can be safe if he can have some medications changed.  After discussing medications with patient informed him that he needs to be taking his Vistaril at bedtime as he informed me that he has taken it first thing in the morning.  He also reports that he only takes 10 mg of Lexapro and has been on it for approximately 2 years and he is informed and will increase it to 20 mg a day.  I also agreed to start patient on Vistaril 25 mg p.o. 3 times daily as needed for anxiety.  Educated patient on his medications and doses in approximate dosing time.  Patient denies any suicidal homicidal ideations and denies any hallucinations.  Patient reports that he feels safe living at home with his parents.  He states that he does need to have a follow-up appointment for outpatient services.  TTS is assisting patient with outpatient appointment for psychiatry.  Total Time spent with patient: 20 minutes  Psychiatric Specialty Exam  Presentation  General Appearance:Casual  Eye Contact:Good  Speech:Clear and Coherent;Normal Rate  Speech Volume:Normal  Handedness:Right   Mood and Affect   Mood:Anxious  Affect:Appropriate;Congruent   Thought Process  Thought Processes:Coherent  Descriptions of Associations:Intact  Orientation:Full (Time, Place and Person)  Thought Content:WDL  Hallucinations:None  Ideas of Reference:None  Suicidal Thoughts:No  Homicidal Thoughts:No   Sensorium  Memory:Immediate Good;Recent Good;Remote Good  Judgment:Fair  Insight:Fair   Executive Functions  Concentration:Good  Attention Span:Good  Recall:Fair  Fund of Knowledge:Fair  Language:Good   Psychomotor Activity  Psychomotor Activity:No data recorded  Assets  Assets:Communication Skills;Desire for Improvement;Financial Resources/Insurance;Housing;Physical Health;Social Support;Transportation   Sleep  Sleep:Fair  Number of hours: No data recorded  Physical Exam: Physical Exam Vitals and nursing note reviewed.  Constitutional:      Appearance: He is well-developed.  Cardiovascular:     Rate and Rhythm: Normal rate.  Pulmonary:     Effort: Pulmonary effort is normal.  Musculoskeletal:        General: Normal range of motion.  Skin:    General: Skin is warm.  Neurological:     Mental Status: He is alert and oriented to person, place, and time.    Review of Systems  Constitutional: Negative.   HENT: Negative.   Eyes: Negative.   Respiratory: Negative.   Cardiovascular: Negative.   Gastrointestinal: Negative.   Genitourinary: Negative.   Musculoskeletal: Negative.   Skin: Negative.   Neurological: Negative.   Endo/Heme/Allergies: Negative.   Psychiatric/Behavioral: Positive for depression. The patient is nervous/anxious.    Blood pressure 127/76, pulse 91, temperature 97.7 F (36.5 C), temperature source Oral,  resp. rate 18, height 5\' 7"  (1.702 m), weight 240 lb (108.9 kg), SpO2 97 %. Body mass index is 37.59 kg/m.  Musculoskeletal: Strength & Muscle Tone: within normal limits Gait & Station: normal Patient leans:  N/A   Recommendations:  Based on my evaluation the patient does not appear to have an emergency medical condition.  Lucion Dilger, FNP 08/31/2019, 3:05 PM

## 2019-08-31 NOTE — ED Notes (Signed)
Offered pt water.

## 2019-08-31 NOTE — ED Notes (Signed)
Patient has no belongings. 

## 2019-09-02 ENCOUNTER — Telehealth (HOSPITAL_COMMUNITY): Payer: Self-pay | Admitting: Licensed Clinical Social Worker

## 2019-09-07 ENCOUNTER — Telehealth (HOSPITAL_COMMUNITY): Payer: Self-pay | Admitting: Licensed Clinical Social Worker

## 2019-09-13 ENCOUNTER — Telehealth (HOSPITAL_COMMUNITY): Payer: Self-pay | Admitting: Professional

## 2020-03-11 ENCOUNTER — Emergency Department
Admission: EM | Admit: 2020-03-11 | Discharge: 2020-03-11 | Disposition: A | Discharge status: Home / Self Care | Payer: Federal, State, Local not specified - PPO | Attending: Emergency Medicine | Admitting: Emergency Medicine

## 2020-03-11 ENCOUNTER — Other Ambulatory Visit: Payer: Self-pay

## 2020-03-11 DIAGNOSIS — R112 Nausea with vomiting, unspecified: Secondary | ICD-10-CM | POA: Diagnosis present

## 2020-03-11 DIAGNOSIS — K2970 Gastritis, unspecified, without bleeding: Secondary | ICD-10-CM

## 2020-03-11 LAB — URINALYSIS, COMPLETE (UACMP) WITH MICROSCOPIC
Bacteria, UA: NONE SEEN
Bilirubin Urine: NEGATIVE
Glucose, UA: NEGATIVE mg/dL
Hgb urine dipstick: NEGATIVE
Ketones, ur: NEGATIVE mg/dL
Leukocytes,Ua: NEGATIVE
Nitrite: NEGATIVE
Protein, ur: NEGATIVE mg/dL
Specific Gravity, Urine: 1.005 (ref 1.005–1.030)
Squamous Epithelial / HPF: NONE SEEN (ref 0–5)
WBC, UA: NONE SEEN WBC/hpf (ref 0–5)
pH: 6 (ref 5.0–8.0)

## 2020-03-11 LAB — COMPREHENSIVE METABOLIC PANEL
ALT: 18 U/L (ref 0–44)
AST: 18 U/L (ref 15–41)
Albumin: 4.4 g/dL (ref 3.5–5.0)
Alkaline Phosphatase: 65 U/L (ref 38–126)
Anion gap: 7 (ref 5–15)
BUN: 11 mg/dL (ref 6–20)
CO2: 25 mmol/L (ref 22–32)
Calcium: 9.2 mg/dL (ref 8.9–10.3)
Chloride: 105 mmol/L (ref 98–111)
Creatinine, Ser: 0.91 mg/dL (ref 0.61–1.24)
GFR, Estimated: >60 mL/min (ref >60)
Glucose, Bld: 115 mg/dL — ABNORMAL HIGH (ref 70–99)
Potassium: 3.5 mmol/L (ref 3.5–5.1)
Sodium: 137 mmol/L (ref 135–145)
Total Bilirubin: 0.9 mg/dL (ref 0.3–1.2)
Total Protein: 7.5 g/dL (ref 6.5–8.1)

## 2020-03-11 LAB — CBC
HCT: 46 % (ref 39.0–52.0)
Hemoglobin: 15.6 g/dL (ref 13.0–17.0)
MCH: 26.5 pg (ref 26.0–34.0)
MCHC: 33.9 g/dL (ref 30.0–36.0)
MCV: 78.2 fL — ABNORMAL LOW (ref 80.0–100.0)
Platelets: 173 10*3/uL (ref 150–400)
RBC: 5.88 MIL/uL — ABNORMAL HIGH (ref 4.22–5.81)
RDW: 12.9 % (ref 11.5–15.5)
WBC: 8.6 10*3/uL (ref 4.0–10.5)
nRBC: 0 % (ref 0.0–0.2)

## 2020-03-11 LAB — LIPASE, BLOOD: Lipase: 49 U/L (ref 11–51)

## 2020-03-11 MED ORDER — FENTANYL CITRATE (PF) 100 MCG/2ML IJ SOLN
50.0000 ug | Freq: Once | INTRAMUSCULAR | Status: DC
  Filled 2020-03-11: qty 2

## 2020-03-11 MED ORDER — PANTOPRAZOLE SODIUM 40 MG PO TBEC: 40.0000 mg | DELAYED_RELEASE_TABLET | Freq: Every day | ORAL | 1 refills | Status: AC

## 2020-03-11 MED ORDER — LACTATED RINGERS IV BOLUS
1000.0000 mL | Freq: Once | INTRAVENOUS | Status: AC
  Administered 2020-03-11: 1000 mL via INTRAVENOUS

## 2020-03-11 MED ORDER — FAMOTIDINE IN NACL 20-0.9 MG/50ML-% IV SOLN
20.0000 mg | Freq: Once | INTRAVENOUS | Status: AC
  Administered 2020-03-11: 20 mg via INTRAVENOUS
  Filled 2020-03-11: qty 50

## 2020-03-11 MED ORDER — ONDANSETRON HCL 4 MG/2ML IJ SOLN
4.0000 mg | Freq: Once | INTRAMUSCULAR | Status: AC
  Administered 2020-03-11: 4 mg via INTRAVENOUS
  Filled 2020-03-11: qty 2

## 2020-03-11 MED ORDER — ONDANSETRON 4 MG PO TBDP: 4.0000 mg | ORAL_TABLET | Freq: Three times a day (TID) | ORAL | 0 refills | Status: AC | PRN

## 2020-03-11 NOTE — ED Notes (Signed)
ED Provider at bedside. 

## 2020-03-11 NOTE — ED Triage Notes (Signed)
Pt presents to ER via ems from work.  Pt c/o chronic cough and vomiting tonight.  Pt states he has chronic vomiting and has seen GI, but nothing was found.  Pt states he came in tonight d/t slight panic attack d/t not being able to do job well.  Pt denies diarrhea but states he has some slight lower abd pain.

## 2020-03-11 NOTE — Discharge Instructions (Addendum)
Your symptoms are most likely caused by gastritis which is an inflammation of your stomach.  Avoid drinking alcohol, eating spicy food or taking NSAIDs (ibuprofen, BC powder, Motrin, aspirin) as these things may cause exacerbation of your symptoms.  It is very important they follow-up with a GI specialist for management of your chronic symptoms and further testing to rule out IBD, IBS, celiac disease, or any other causes of your symptoms.  In the meantime make sure to take Protonix 40 mg on a daily basis to help your stomach heal.  Take Zofran as needed for nausea.  Return to the emergency room for new or worsening abdominal pain, fever, chest pain.

## 2020-03-11 NOTE — ED Notes (Signed)
Pt sts he is feeling better after nausea medication and fluids.

## 2020-03-11 NOTE — ED Notes (Signed)
Pt instructed to provide urine sample when able; sts he urinated while in waiting area and is unable to at this time.

## 2020-03-11 NOTE — ED Provider Notes (Signed)
[facility] Emergency Department Provider Note  ____________________________________________  Time seen: Approximately 3:53 AM  I have reviewed the triage vital signs and the nursing notes.   HISTORY  Chief Complaint Emesis and Cough   HPI Juan Weber is a 25 y.o. male with a history of Asperger's disorder, GERD, H. pylori gastritis, chronic abdominal pain, nausea, and vomiting who presents for evaluation of nausea and vomiting.  Patient reports that for the last year or longer he has daily episodes of vomiting and diarrhea.  He is unable to keep anything down.  Has chronic abdominal pain.  Has been seen several times in the past including by a GI specialist at [facility] with an endoscopy and colonoscopy.  He was diagnosed with H. pylori.  He reports finishing the treatment but never followed up with GI.  Reports no changes in his symptoms.  He reports alcohol use occasionally, marijuana use occasionally.  He feels that his symptoms sometimes are associated with stress.  He reports being under a lot of stress.  Today he was at work and was not doing his job well when he had a panic attack and started vomiting again.  He felt dizzy like he was going to pass out.  He is currently complaining of diffuse burning abdominal pain which she has often with these episodes.  He denies chest pain, shortness of breath, SI or HI, fever or chills.   Past Medical History:  Diagnosis Date  . Allergy   . Anxiety   . Asperger's disorder   . Chest pain    [facility] The pt exercised according to Bruce Protocol, Total time recorded 6:58 min achieving max heart rate of 240 which was 120% of MPHR for age and 8.75 METS of work. Normal BP response. Resting ECG showing sinus tach. There was no ST-T changes of ischemia with exercise stress test. No arrythmias noted.   . Depression   . GERD (gastroesophageal reflux disease)   . Scoliosis   . Ulcer     Patient Active  Problem List   Diagnosis Date Noted  . Asperger's syndrome 02/21/2011    Past Surgical History:  Procedure Laterality Date  . COLONOSCOPY    . UPPER GI ENDOSCOPY      Prior to Admission medications   Medication Sig Start Date End Date Taking? Authorizing Provider  ondansetron (ZOFRAN ODT) 4 MG disintegrating tablet Take 1 tablet (4 mg total) by mouth every 8 (eight) hours as needed. 03/11/20  Yes Herchel Hopkin, [place], [place]  pantoprazole (PROTONIX) 40 MG tablet Take 1 tablet (40 mg total) by mouth daily. 03/11/20 03/11/21 Yes Macey Wurtz, [place], MD  buPROPion (WELLBUTRIN XL) 300 MG 24 hr tablet Take 300 mg by mouth daily.    [provider]  escitalopram (LEXAPRO) 20 MG tablet Take 1 tablet (20 mg total) by mouth daily. 08/31/19   Money, Gerlene Burdock, FNP  gabapentin (NEURONTIN) 100 MG capsule Take 100 mg by mouth daily as needed (carpel tunnel).  12/20/18   [provider]  hydrOXYzine (VISTARIL) 25 MG capsule Take 1 capsule (25 mg total) by mouth 3 (three) times daily as needed. 08/31/19   Money, Gerlene Burdock, FNP  ibuprofen (ADVIL) 800 MG tablet Take 800 mg by mouth daily as needed for headache or moderate pain.     [provider]  omeprazole (PRILOSEC) 20 MG capsule TAKE 1 CAPSULE (20 MG TOTAL) BY MOUTH DAILY. Patient not taking: Reported on 05/08/2019 07/31/15   Deliah Boston  L, PA-C  risperidone (RISPERDAL) 4 MG tablet Take 4 mg by mouth at bedtime. 08/26/19   [provider]  VYVANSE 70 MG capsule Take 70 mg by mouth daily. 05/31/15   [provider]  amoxicillin-clarithromycin-lansoprazole [facility](PREVPAC) combo pack Take by mouth 2 (two) times daily. Follow package directions. Stop taking omeprazole once staring this regimen. DO NOT MISS DOSES. Patient not taking: Reported on 03/21/2019 06/21/15 03/21/19  Ofilia Neaslark, Michael L, PA-C    Allergies Patient has no known allergies.  Family History  Adopted: Yes    Social History Social History   Tobacco Use  .  Smoking status: Never Smoker  . Smokeless tobacco: Never Used  Vaping Use  . Vaping Use: Every day  . Substances: Nicotine, CBD  Substance Use Topics  . Alcohol use: Yes    Alcohol/week: 0.0 standard drinks  . Drug use: No    Review of Systems  Constitutional: Negative for fever. Eyes: Negative for visual changes. ENT: Negative for sore throat. Neck: No neck pain  Cardiovascular: Negative for chest pain. Respiratory: Negative for shortness of breath. Gastrointestinal: + abdominal pain, nausea, vomiting, and diarrhea. Genitourinary: Negative for dysuria. Musculoskeletal: Negative for back pain. Skin: Negative for rash. Neurological: Negative for headaches, weakness or numbness. Psych: No SI or HI  ____________________________________________   PHYSICAL EXAM:  VITAL SIGNS: ED Triage Vitals  Enc Vitals Group     BP 03/11/20 0047 130/88     Pulse Rate 03/11/20 0047 92     Resp 03/11/20 0047 15     Temp 03/11/20 0047 98.1 F (36.7 C)     Temp Source 03/11/20 0047 Oral     SpO2 03/11/20 0047 97 %     Weight 03/11/20 0051 208 lb 0.1 oz (94.3 kg)     Height 03/11/20 0051 5\' 7"  (1.702 m)     Head Circumference --      Peak Flow --      Pain Score 03/11/20 0048 2     Pain Loc --      Pain Edu? --      Excl. in GC? --     Constitutional: Alert and oriented. Well appearing and in no apparent distress. HEENT:      Head: Normocephalic and atraumatic.         Eyes: Conjunctivae are normal. Sclera is non-icteric.       Mouth/Throat: Mucous membranes are moist.       Neck: Supple with no signs of meningismus. Cardiovascular: Regular rate and rhythm. No murmurs, gallops, or rubs.  Respiratory: Normal respiratory effort. Lungs are clear to auscultation bilaterally.  Gastrointestinal: Soft, mild diffuse tenderness, non distended with positive bowel sounds. No rebound or guarding. Genitourinary: No CVA tenderness. Musculoskeletal: No edema, cyanosis, or erythema of  extremities. Neurologic: Normal speech and language. Face is symmetric. Moving all extremities. No gross focal neurologic deficits are appreciated. Skin: Skin is warm, dry and intact. No rash noted. Psychiatric: Mood and affect are normal. Speech and behavior are normal.  ____________________________________________   LABS (all labs ordered are listed, but only abnormal results are displayed)  Labs Reviewed  COMPREHENSIVE METABOLIC PANEL - Abnormal; Notable for the following components:      Result Value   Glucose, Bld 115 (*)    All other components within normal limits  CBC - Abnormal; Notable for the following components:   RBC 5.88 (*)    MCV 78.2 (*)    All other components within normal limits  URINALYSIS,  COMPLETE (UACMP) WITH MICROSCOPIC - Abnormal; Notable for the following components:   Color, Urine YELLOW (*)    APPearance CLEAR (*)    All other components within normal limits  LIPASE, BLOOD   ____________________________________________  EKG  none  ____________________________________________  RADIOLOGY  none  ____________________________________________   PROCEDURES  Procedure(s) performed: None Procedures Critical Care performed:  None ____________________________________________   INITIAL IMPRESSION / ASSESSMENT AND PLAN / ED COURSE  25 y.o. male with a history of Asperger's disorder, GERD, H. pylori gastritis, chronic abdominal pain, nausea, and vomiting who presents for evaluation of nausea, vomiting, and abdominal pain.  Patient symptoms are chronic and have been ongoing for over a year.  Patient was seen by GI specialist at [facility] in March 2021 with an endoscopy and colonoscopy for the symptoms.  I am unable to find the results of these tests but I can see the note from the GI doctor that states that patient was found to have H. pylori gastritis and esophagitis.  Patient is supposed to be on Protonix daily but has not been taking it.  Patient  reports taking all the medications that were prescribed for H. pylori treatment but reports no changes in his symptoms.  He feels that his symptoms are associated with stress.  He feels that he is under a lot of stress at work right now.  He is otherwise well-appearing and in no distress with normal vital signs, abdomen is nondistended with mild diffuse tenderness, no localized tenderness, rebound or guarding.  Describes the pain as burning and reports that this pain is unchanged from the last year.  I did review patient's CAT scan from February 2021 which showed sequela of prior infection or inflammation in the terminal ileum and rectum and there was some concern of possible inflammatory bowel disease.  The colonoscopy seems to have happened after that and it was negative for IBD.  His labs here are unremarkable with no signs of dehydration, electrolyte derangements, ketones in his urine, leukocytosis or anemia.  I explained to the patient that what ever he is doing at home is working because he would be impossible for him not to be able to keep anything down for a year like he is describing and have normal electrolytes, normal kidney function, no ketones in his urine.  He received a liter bolus, IV Zofran, and IV Pepcid.  Serial abdominal exams remain benign with no localizing tenderness, rebound or guarding. Patient tolerating PO with no vomiting here. I discussed with patient the importance to avoid NSAIDs, alcohol, and spicy foods to prevent flaring of his gastritis.  Also discussed with him the importance of taking Protonix on a daily basis to allow for his stomach to heal.  Recommended Zofran as needed for nausea and vomiting.  I recommended return to the GI specialist for further management.  We talked about repeating a CT scan but patient declined at this time since he reports that the pain is the same that he has had for the last year.  I believe it is reasonable to avoid radiation at this time and I think  he would benefit much more from a GI evaluation.  I discussed my standard return precautions for localizing abdominal pain, fever, or concerns for dehydration      _____________________________________________ Please note:  Patient was evaluated in Emergency Department today for the symptoms described in the history of present illness. Patient was evaluated in the context of the global COVID-19 pandemic, which necessitated consideration  that the patient might be at risk for infection with the SARS-CoV-2 virus that causes COVID-19. Institutional protocols and algorithms that pertain to the evaluation of patients at risk for COVID-19 are in a state of rapid change based on information released by regulatory bodies including the CDC and federal and state organizations. These policies and algorithms were followed during the patient's care in the ED.  Some ED evaluations and interventions may be delayed as a result of limited staffing during the pandemic.   [place] Controlled Substance Database was reviewed by me. ____________________________________________   FINAL CLINICAL IMPRESSION(S) / ED DIAGNOSES   Final diagnoses:  Gastritis without bleeding, unspecified chronicity, unspecified gastritis type      NEW MEDICATIONS STARTED DURING THIS VISIT:  ED Discharge Orders         Ordered    pantoprazole (PROTONIX) 40 MG tablet  Daily        03/11/20 0350    ondansetron (ZOFRAN ODT) 4 MG disintegrating tablet  Every 8 hours PRN        03/11/20 0350           Note:  This document was prepared using Dragon voice recognition software and may include unintentional dictation errors.    Don Perking, [place], MD 03/11/20 [phone]

## 2020-04-10 ENCOUNTER — Other Ambulatory Visit: Payer: Self-pay | Admitting: Physician Assistant

## 2020-04-10 ENCOUNTER — Other Ambulatory Visit (HOSPITAL_COMMUNITY): Payer: Self-pay | Admitting: Physician Assistant

## 2020-04-10 DIAGNOSIS — R112 Nausea with vomiting, unspecified: Secondary | ICD-10-CM

## 2020-04-24 ENCOUNTER — Ambulatory Visit (HOSPITAL_COMMUNITY)
Admission: RE | Admit: 2020-04-24 | Discharge: 2020-04-24 | Disposition: A | Discharge status: Home / Self Care | Payer: Federal, State, Local not specified - PPO | Source: Ambulatory Visit | Attending: Physician Assistant | Admitting: Physician Assistant

## 2020-04-24 ENCOUNTER — Other Ambulatory Visit: Payer: Self-pay

## 2020-04-24 DIAGNOSIS — R112 Nausea with vomiting, unspecified: Secondary | ICD-10-CM | POA: Insufficient documentation

## 2020-04-24 MED ORDER — TECHNETIUM TC 99M MEBROFENIN IV KIT
5.0000 | PACK | Freq: Once | INTRAVENOUS | Status: AC | PRN
  Administered 2020-04-24: 5 via INTRAVENOUS

## 2020-05-02 ENCOUNTER — Encounter: Payer: Self-pay | Admitting: *Deleted

## 2020-05-02 ENCOUNTER — Ambulatory Visit: Payer: Federal, State, Local not specified - PPO | Admitting: Gastroenterology

## 2020-05-02 ENCOUNTER — Encounter: Payer: Self-pay | Admitting: Gastroenterology

## 2021-03-12 IMAGING — CT CT ABD-PELV W/ CM
2 of 4 series · 16 of 46 positions shown, 18 images · IV contrast (APPLIED)
Comparison: 06/24/2015

CLINICAL DATA: Left lower quadrant abdominal pain

EXAM:
CT ABDOMEN AND PELVIS WITH CONTRAST
TECHNIQUE: Multidetector CT imaging of the abdomen and pelvis was performed
using the standard protocol following bolus administration of
intravenous contrast.
CONTRAST:  100mL OMNIPAQUE IOHEXOL 300 MG/ML  SOLN

[Series 3: abdomen 5.0 · axial · 0.80mm/px · z∈[-458,-3]mm · 13 of 103 slices shown, 15 images]
[im 6/103  soft-tissue]
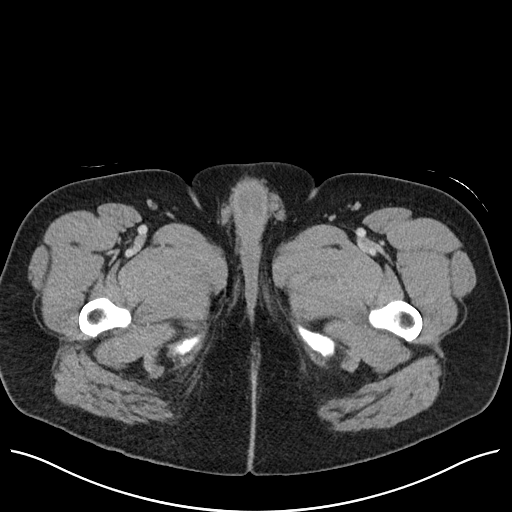
[im 6/103  bone]
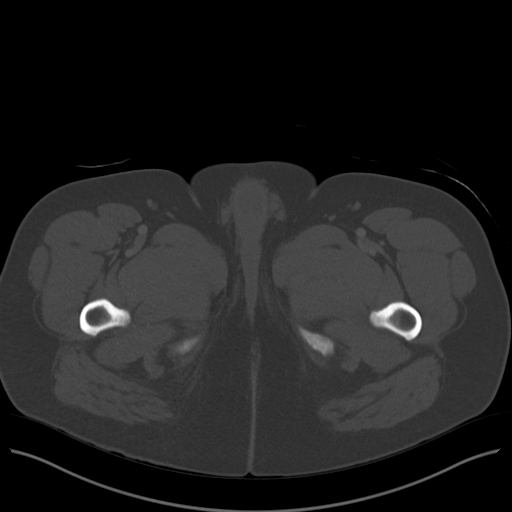
[im 12/103  soft-tissue]
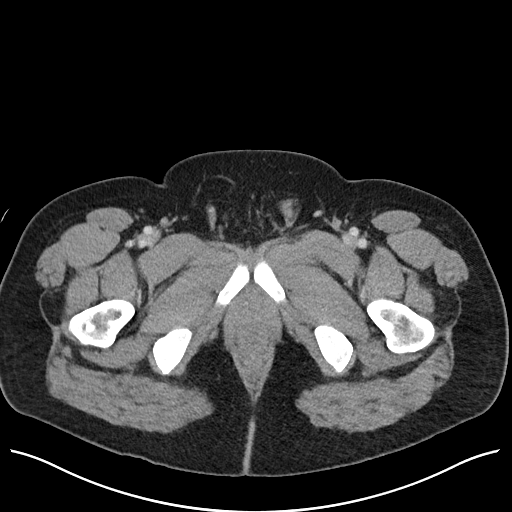
[im 23/103  soft-tissue]
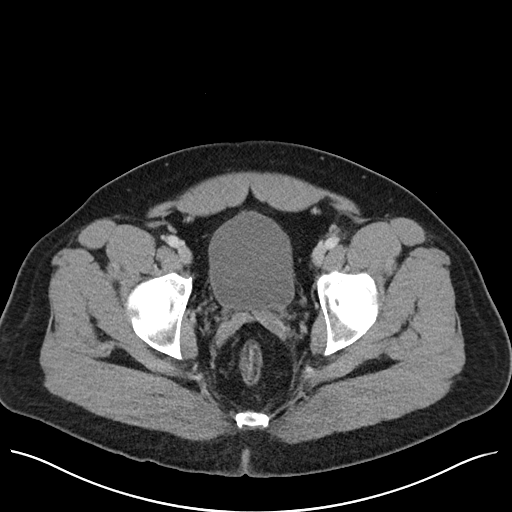
[im 29/103  soft-tissue]
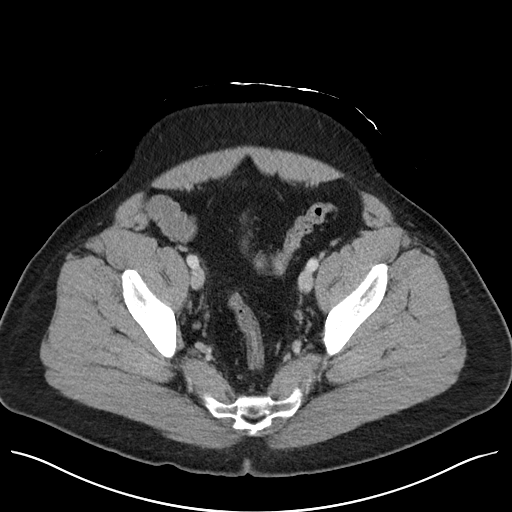
[im 35/103  soft-tissue]
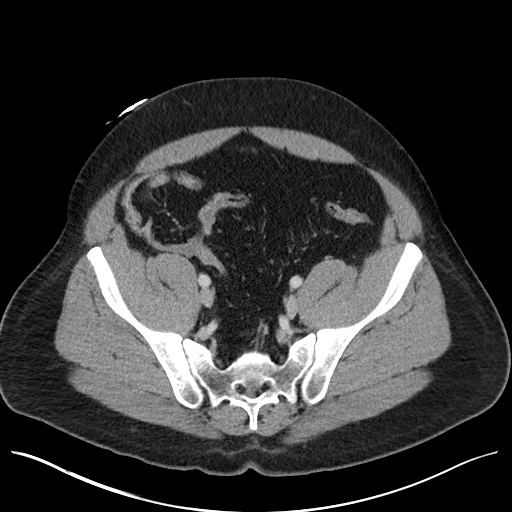
[im 46/103  soft-tissue]
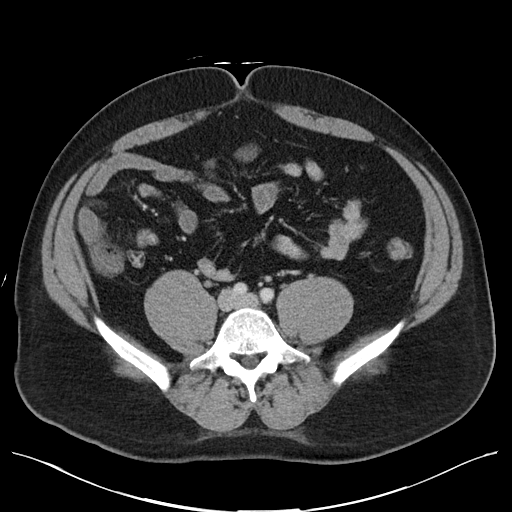
[im 52/103  soft-tissue]
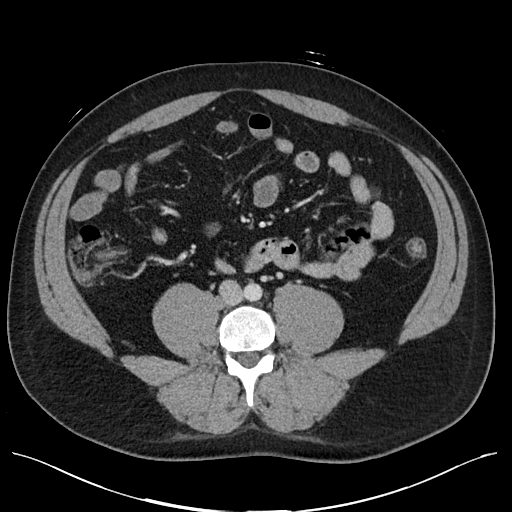
[im 57/103  soft-tissue]
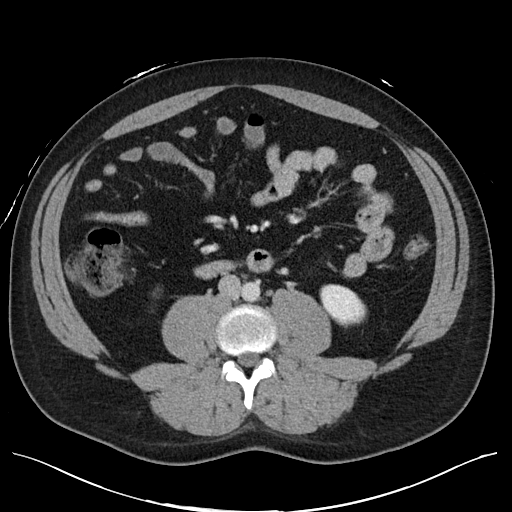
[im 69/103  soft-tissue]
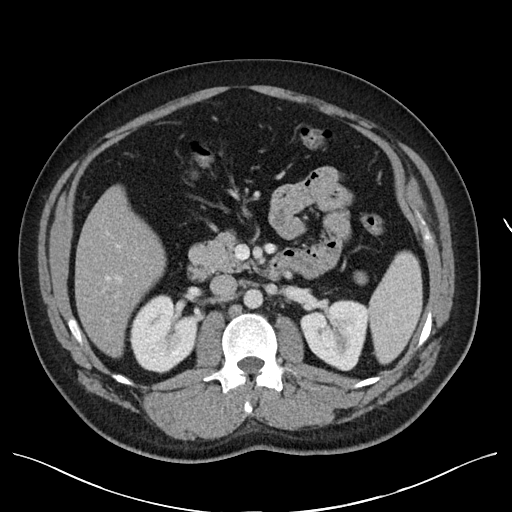
[im 69/103  bone]
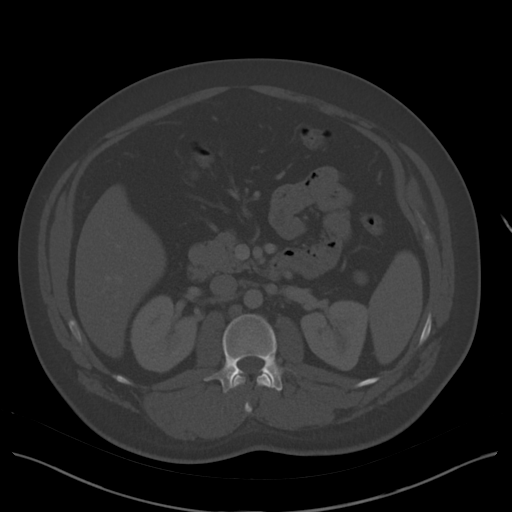
[im 74/103  soft-tissue]
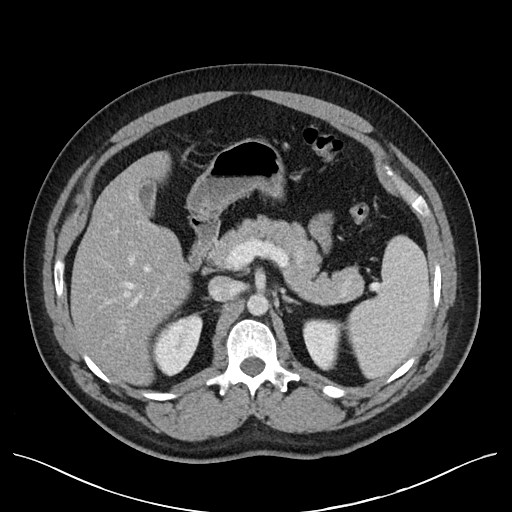
[im 80/103  soft-tissue]
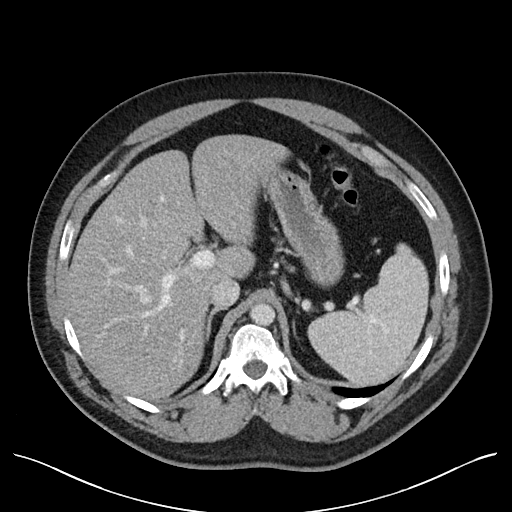
[im 91/103  soft-tissue]
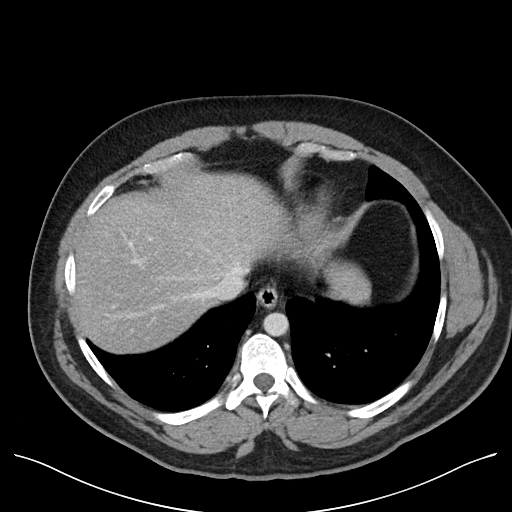
[im 97/103  soft-tissue]
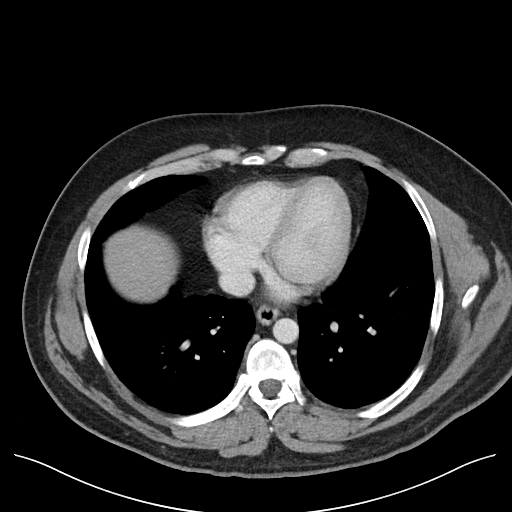

[Series 6: abdomen 3.0 mpr cor · coronal · 0.80mm/px · 3 of 115 slices shown]
[im 39/115  soft-tissue]
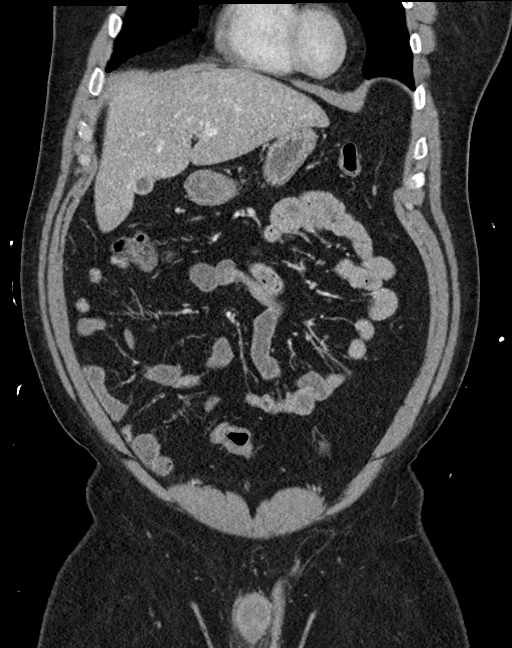
[im 51/115  soft-tissue]
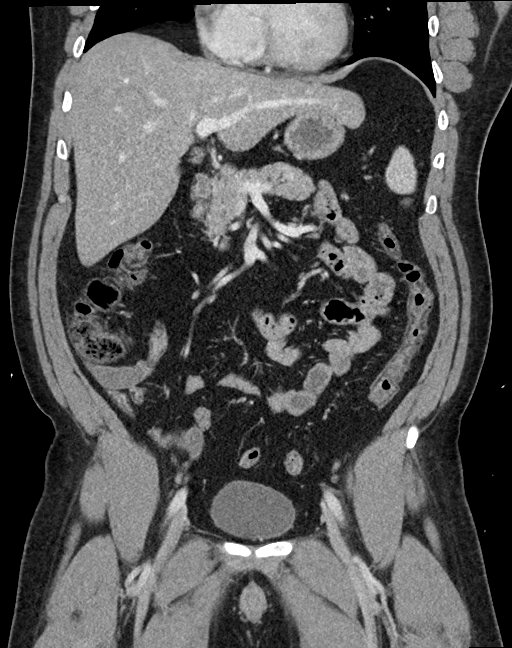
[im 64/115  soft-tissue]
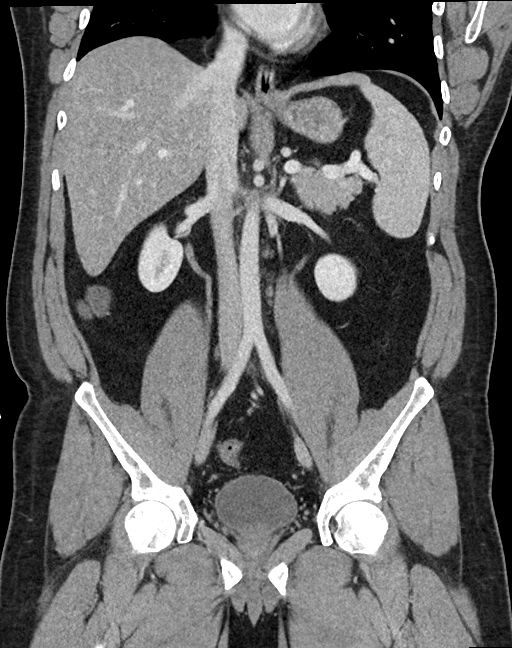

[16 of 46 positions shown; findings below may reference images not displayed]

FINDINGS: Lower chest: No acute abnormality.

Hepatobiliary: No solid liver abnormality is seen. Hepatic
steatosis. No gallstones, gallbladder wall thickening, or biliary
dilatation.

Pancreas: Unremarkable. No pancreatic ductal dilatation or
surrounding inflammatory changes.

Spleen: Normal in size without significant abnormality.

Adrenals/Urinary Tract: Adrenal glands are unremarkable. Kidneys are
normal, without renal calculi, solid lesion, or hydronephrosis.
Bladder is unremarkable.

Stomach/Bowel: Stomach is within normal limits. Fatty mural
stratification of of the terminal ileum (series 3, image 52), and
the rectum (series 3, image 83, series 7, image 69). Appendix
appears normal. No evidence of bowel wall thickening, distention, or
inflammatory changes.

Vascular/Lymphatic: No significant vascular findings are present. No
enlarged abdominal or pelvic lymph nodes.

Reproductive: No mass or other significant abnormality.

Other: No abdominal wall hernia or abnormality. No abdominopelvic
ascites.

Musculoskeletal: No acute or significant osseous findings.
IMPRESSION: 1. No acute CT findings of the abdomen or pelvis to explain left
lower quadrant pain.
2. Fatty mural stratification of the terminal ileum and rectum may
represent chronic sequela of prior infection or inflammation,
including inflammatory bowel disease such as Crohn's disease.
Findings are similar to prior. Correlate with clinical history, if
present. No evidence of active inflammation.
3. Hepatic steatosis.
